# Patient Record
Sex: Male | Born: 1955 | Race: White | Hispanic: No | Marital: Married | Smoking: Never smoker
Health system: Southern US, Community
[De-identification: ages and names within clinical notes are randomized; demographics above are authoritative.]

## PROBLEM LIST (undated history)

## (undated) ENCOUNTER — Emergency Department (HOSPITAL_COMMUNITY): Admission: EM | Payer: 59 | Source: Home / Self Care

## (undated) DIAGNOSIS — Z125 Encounter for screening for malignant neoplasm of prostate: Secondary | ICD-10-CM

## (undated) DIAGNOSIS — L219 Seborrheic dermatitis, unspecified: Secondary | ICD-10-CM

## (undated) DIAGNOSIS — IMO0002 Reserved for concepts with insufficient information to code with codable children: Secondary | ICD-10-CM

## (undated) DIAGNOSIS — R21 Rash and other nonspecific skin eruption: Secondary | ICD-10-CM

## (undated) DIAGNOSIS — E7800 Pure hypercholesterolemia, unspecified: Secondary | ICD-10-CM

## (undated) DIAGNOSIS — J309 Allergic rhinitis, unspecified: Secondary | ICD-10-CM

## (undated) DIAGNOSIS — Z0000 Encounter for general adult medical examination without abnormal findings: Secondary | ICD-10-CM

## (undated) DIAGNOSIS — K2990 Gastroduodenitis, unspecified, without bleeding: Secondary | ICD-10-CM

## (undated) DIAGNOSIS — K2970 Gastritis, unspecified, without bleeding: Secondary | ICD-10-CM

## (undated) DIAGNOSIS — K649 Unspecified hemorrhoids: Secondary | ICD-10-CM

## (undated) DIAGNOSIS — K219 Gastro-esophageal reflux disease without esophagitis: Secondary | ICD-10-CM

## (undated) HISTORY — DX: Encounter for screening for malignant neoplasm of prostate: Z12.5

## (undated) HISTORY — DX: Encounter for general adult medical examination without abnormal findings: Z00.00

## (undated) HISTORY — DX: Unspecified hemorrhoids: K64.9

## (undated) HISTORY — PX: MRI: SHX5353

## (undated) HISTORY — DX: Gastro-esophageal reflux disease without esophagitis: K21.9

## (undated) HISTORY — DX: Gastroduodenitis, unspecified, without bleeding: K29.90

## (undated) HISTORY — DX: Rash and other nonspecific skin eruption: R21

## (undated) HISTORY — DX: Pure hypercholesterolemia, unspecified: E78.00

## (undated) HISTORY — DX: Reserved for concepts with insufficient information to code with codable children: IMO0002

## (undated) HISTORY — DX: Allergic rhinitis, unspecified: J30.9

## (undated) HISTORY — DX: Seborrheic dermatitis, unspecified: L21.9

## (undated) HISTORY — DX: Gastritis, unspecified, without bleeding: K29.70

---

## 2000-10-14 DIAGNOSIS — IMO0002 Reserved for concepts with insufficient information to code with codable children: Secondary | ICD-10-CM

## 2000-10-14 HISTORY — DX: Reserved for concepts with insufficient information to code with codable children: IMO0002

## 2003-02-22 LAB — FECAL OCCULT BLOOD, GUAIAC: Fecal Occult Blood: NEGATIVE

## 2003-08-17 LAB — HM COLONOSCOPY: HM Colonoscopy: NORMAL

## 2003-10-15 HISTORY — PX: ESOPHAGOGASTRODUODENOSCOPY: SHX1529

## 2003-10-15 HISTORY — PX: COLONOSCOPY: SHX174

## 2003-10-21 ENCOUNTER — Encounter (INDEPENDENT_AMBULATORY_CARE_PROVIDER_SITE_OTHER): Payer: Self-pay | Admitting: *Deleted

## 2003-10-21 ENCOUNTER — Ambulatory Visit (HOSPITAL_COMMUNITY): Admission: RE | Admit: 2003-10-21 | Discharge: 2003-10-21 | Payer: Self-pay | Admitting: Gastroenterology

## 2004-09-23 ENCOUNTER — Ambulatory Visit: Payer: Self-pay | Admitting: Family Medicine

## 2005-03-16 ENCOUNTER — Ambulatory Visit: Payer: Self-pay | Admitting: Family Medicine

## 2005-06-15 ENCOUNTER — Emergency Department (HOSPITAL_COMMUNITY): Admission: EM | Admit: 2005-06-15 | Discharge: 2005-06-15 | Payer: Self-pay | Admitting: Family Medicine

## 2005-09-27 ENCOUNTER — Ambulatory Visit: Payer: Self-pay | Admitting: Family Medicine

## 2005-10-15 ENCOUNTER — Ambulatory Visit: Payer: Self-pay | Admitting: Family Medicine

## 2005-10-22 ENCOUNTER — Ambulatory Visit: Payer: Self-pay | Admitting: Family Medicine

## 2005-12-06 ENCOUNTER — Ambulatory Visit: Payer: Self-pay | Admitting: Family Medicine

## 2006-03-07 ENCOUNTER — Ambulatory Visit: Payer: Self-pay | Admitting: Family Medicine

## 2006-10-03 ENCOUNTER — Ambulatory Visit: Payer: Self-pay | Admitting: Family Medicine

## 2006-11-18 ENCOUNTER — Ambulatory Visit: Payer: Self-pay | Admitting: Family Medicine

## 2007-02-02 ENCOUNTER — Encounter: Payer: Self-pay | Admitting: Family Medicine

## 2007-02-02 DIAGNOSIS — K219 Gastro-esophageal reflux disease without esophagitis: Secondary | ICD-10-CM

## 2007-02-02 DIAGNOSIS — K2970 Gastritis, unspecified, without bleeding: Secondary | ICD-10-CM | POA: Insufficient documentation

## 2007-02-02 DIAGNOSIS — K2990 Gastroduodenitis, unspecified, without bleeding: Secondary | ICD-10-CM

## 2007-02-02 DIAGNOSIS — E785 Hyperlipidemia, unspecified: Secondary | ICD-10-CM | POA: Insufficient documentation

## 2007-02-14 ENCOUNTER — Ambulatory Visit: Payer: Self-pay | Admitting: Family Medicine

## 2007-02-24 LAB — CONVERTED CEMR LAB
ALT: 18 units/L (ref 0–53)
Alkaline Phosphatase: 64 units/L (ref 39–117)
BUN: 15 mg/dL (ref 6–23)
Basophils Relative: 0.3 % (ref 0.0–1.0)
Bilirubin, Direct: 0.1 mg/dL (ref 0.0–0.3)
CO2: 29 meq/L (ref 19–32)
Calcium: 9.3 mg/dL (ref 8.4–10.5)
Cholesterol: 248 mg/dL (ref 0–200)
Creatinine, Ser: 1 mg/dL (ref 0.4–1.5)
Eosinophils Relative: 2.5 % (ref 0.0–5.0)
GFR calc Af Amer: 102 mL/min
Glucose, Bld: 93 mg/dL (ref 70–99)
Hemoglobin: 15 g/dL (ref 13.0–17.0)
Lymphocytes Relative: 24.6 % (ref 12.0–46.0)
Monocytes Absolute: 0.3 10*3/uL (ref 0.2–0.7)
Monocytes Relative: 4.4 % (ref 3.0–11.0)
Neutro Abs: 4 10*3/uL (ref 1.4–7.7)
Platelets: 309 10*3/uL (ref 150–400)
RDW: 12.1 % (ref 11.5–14.6)
Total Bilirubin: 1.5 mg/dL — ABNORMAL HIGH (ref 0.3–1.2)
Total CHOL/HDL Ratio: 6.6
Total Protein: 7.1 g/dL (ref 6.0–8.3)
VLDL: 26 mg/dL (ref 0–40)
WBC: 5.9 10*3/uL (ref 4.5–10.5)

## 2007-05-25 ENCOUNTER — Ambulatory Visit: Payer: Self-pay | Admitting: Family Medicine

## 2007-05-30 ENCOUNTER — Encounter (INDEPENDENT_AMBULATORY_CARE_PROVIDER_SITE_OTHER): Payer: Self-pay | Admitting: *Deleted

## 2007-05-30 LAB — CONVERTED CEMR LAB
ALT: 19 units/L (ref 0–53)
AST: 20 units/L (ref 0–37)
Direct LDL: 180.8 mg/dL
Total CHOL/HDL Ratio: 6.5
Triglycerides: 87 mg/dL (ref 0–149)
VLDL: 17 mg/dL (ref 0–40)

## 2007-06-07 ENCOUNTER — Telehealth: Payer: Self-pay | Admitting: Family Medicine

## 2007-07-12 ENCOUNTER — Ambulatory Visit: Payer: Self-pay | Admitting: Family Medicine

## 2007-10-02 ENCOUNTER — Encounter: Admission: RE | Admit: 2007-10-02 | Discharge: 2007-10-02 | Payer: Self-pay | Admitting: Family Medicine

## 2007-10-02 ENCOUNTER — Telehealth: Payer: Self-pay | Admitting: Family Medicine

## 2007-10-02 ENCOUNTER — Ambulatory Visit: Payer: Self-pay | Admitting: Family Medicine

## 2007-10-02 ENCOUNTER — Ambulatory Visit: Payer: Self-pay | Admitting: Cardiovascular Disease

## 2007-10-04 ENCOUNTER — Encounter: Payer: Self-pay | Admitting: Family Medicine

## 2007-10-04 ENCOUNTER — Telehealth: Payer: Self-pay | Admitting: Family Medicine

## 2007-10-05 ENCOUNTER — Encounter: Payer: Self-pay | Admitting: Family Medicine

## 2007-11-01 ENCOUNTER — Telehealth: Payer: Self-pay | Admitting: Family Medicine

## 2007-11-09 ENCOUNTER — Encounter: Payer: Self-pay | Admitting: Family Medicine

## 2008-02-19 ENCOUNTER — Ambulatory Visit: Payer: Self-pay | Admitting: Family Medicine

## 2008-02-21 LAB — CONVERTED CEMR LAB
Cholesterol: 205 mg/dL (ref 0–200)
Direct LDL: 151.2 mg/dL
Total CHOL/HDL Ratio: 6.3

## 2008-06-07 ENCOUNTER — Ambulatory Visit: Payer: Self-pay | Admitting: Family Medicine

## 2008-06-07 DIAGNOSIS — K649 Unspecified hemorrhoids: Secondary | ICD-10-CM | POA: Insufficient documentation

## 2008-08-29 ENCOUNTER — Ambulatory Visit: Payer: Self-pay | Admitting: Family Medicine

## 2008-08-29 DIAGNOSIS — L219 Seborrheic dermatitis, unspecified: Secondary | ICD-10-CM | POA: Insufficient documentation

## 2008-09-05 ENCOUNTER — Telehealth (INDEPENDENT_AMBULATORY_CARE_PROVIDER_SITE_OTHER): Payer: Self-pay | Admitting: *Deleted

## 2008-10-29 ENCOUNTER — Telehealth (INDEPENDENT_AMBULATORY_CARE_PROVIDER_SITE_OTHER): Payer: Self-pay | Admitting: *Deleted

## 2008-11-13 ENCOUNTER — Ambulatory Visit: Payer: Self-pay | Admitting: Family Medicine

## 2008-11-13 DIAGNOSIS — J309 Allergic rhinitis, unspecified: Secondary | ICD-10-CM | POA: Insufficient documentation

## 2008-12-16 ENCOUNTER — Ambulatory Visit: Payer: Self-pay | Admitting: Family Medicine

## 2008-12-17 LAB — CONVERTED CEMR LAB
AST: 21 units/L (ref 0–37)
Cholesterol: 196 mg/dL (ref 0–200)
Triglycerides: 98 mg/dL (ref 0.0–149.0)
VLDL: 19.6 mg/dL (ref 0.0–40.0)

## 2009-04-30 ENCOUNTER — Ambulatory Visit: Payer: Self-pay | Admitting: Family Medicine

## 2009-07-07 ENCOUNTER — Ambulatory Visit: Payer: Self-pay | Admitting: Family Medicine

## 2009-07-07 DIAGNOSIS — R21 Rash and other nonspecific skin eruption: Secondary | ICD-10-CM

## 2009-07-11 IMAGING — CT CT PELVIS W/O CM
2 of 4 series · 17 of 46 positions shown, 19 images · IV contrast (agent unspecified)
Comparison: none

CLINICAL DATA: Left flank pain, nausea, microhematuria.
ABDOMEN CT WITHOUT CONTRAST:
TECHNIQUE: Multidetector CT imaging of the abdomen was performed following the standard protocol without IV contrast.
TECHNIQUE: Multidetector CT imaging of the pelvis was performed following the standard protocol without IV contrast.

[Series 2: abd_pel_w/o 5.0 b30f st · axial · 0.77mm/px · z∈[-484,-30]mm · 14 of 101 slices shown, 16 images]
[im 5/101  soft-tissue]
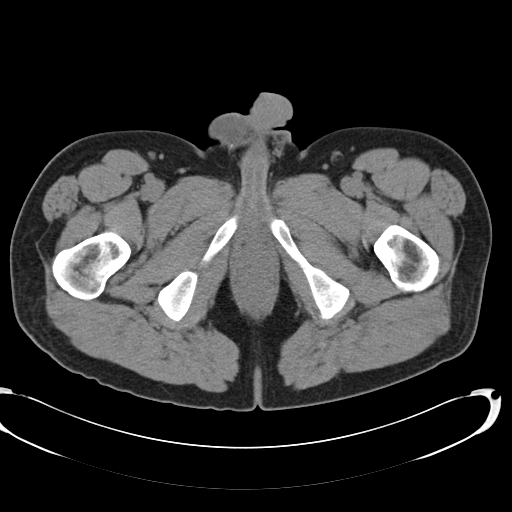
[im 5/101  bone]
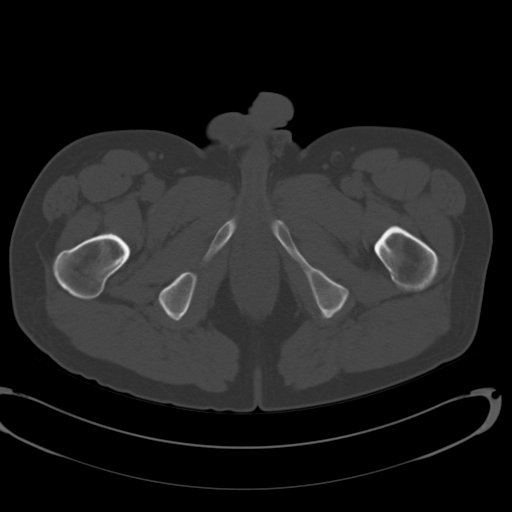
[im 14/101  soft-tissue]
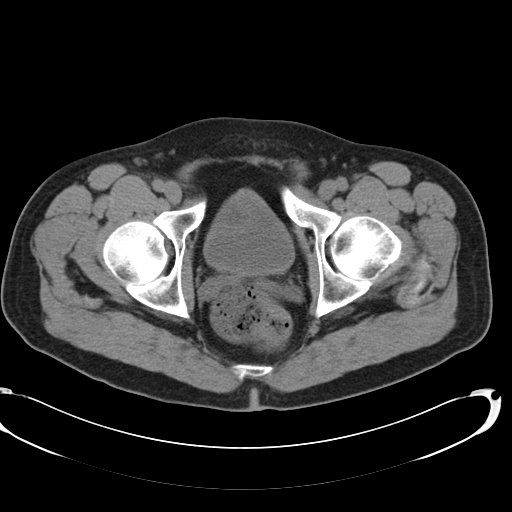
[im 18/101  soft-tissue]
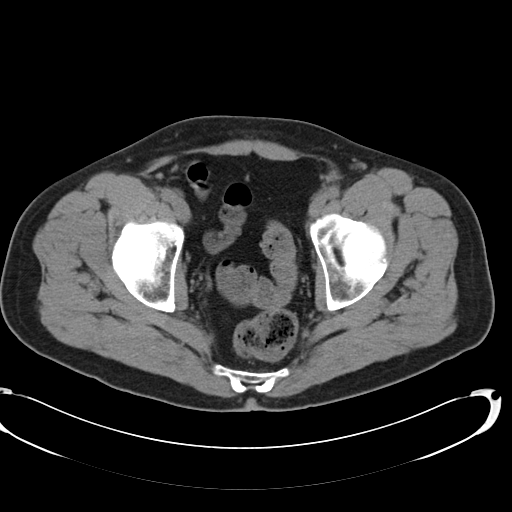
[im 27/101  soft-tissue]
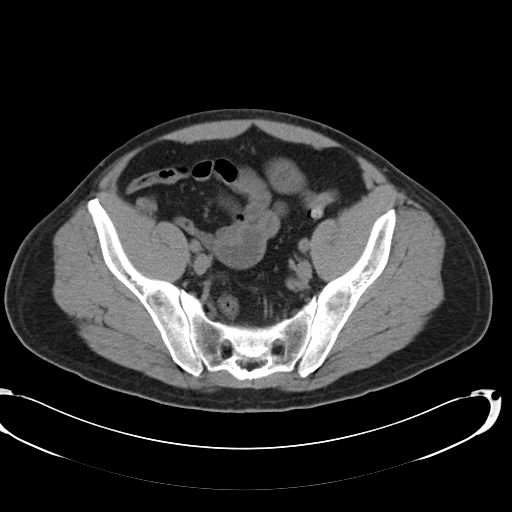
[im 35/101  soft-tissue]
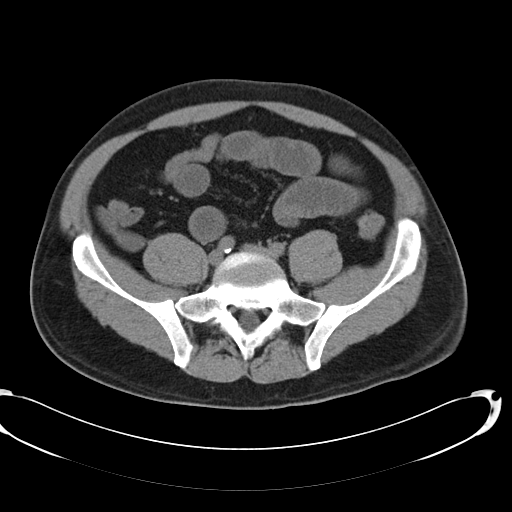
[im 40/101  soft-tissue]
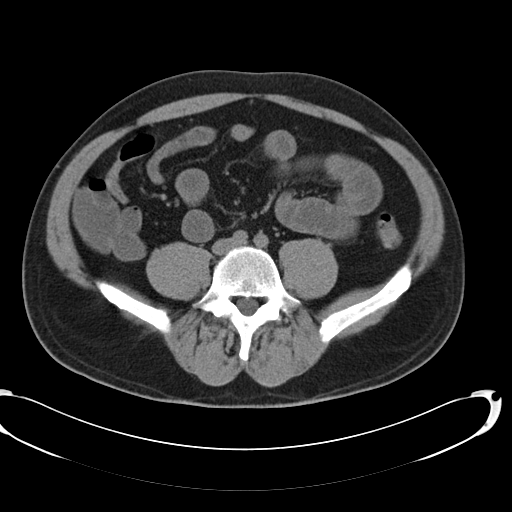
[im 48/101  soft-tissue]
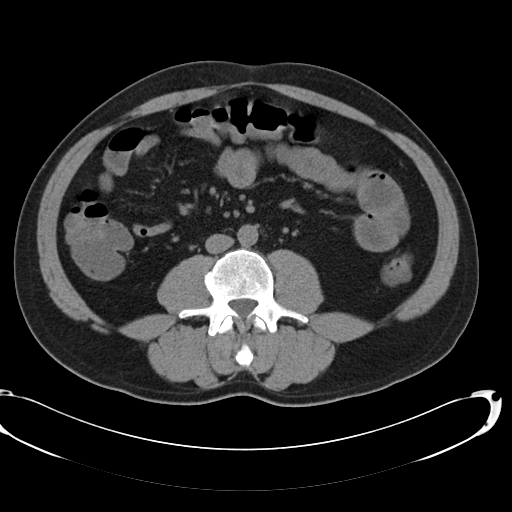
[im 53/101  soft-tissue]
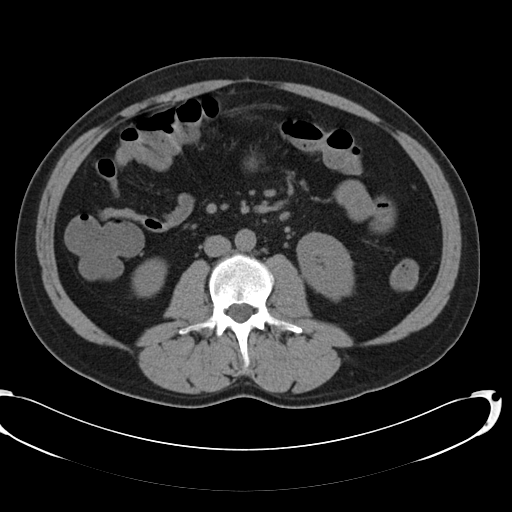
[im 61/101  soft-tissue]
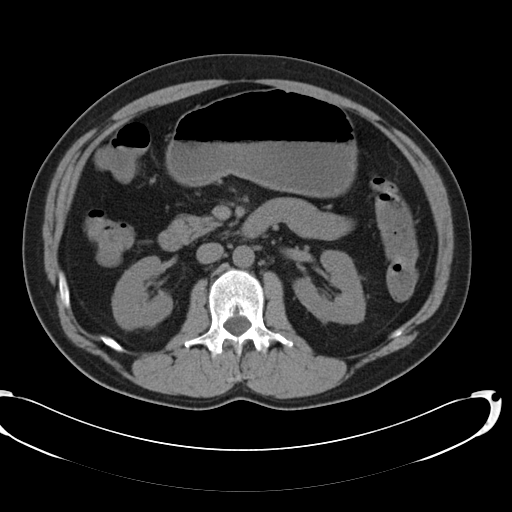
[im 61/101  bone]
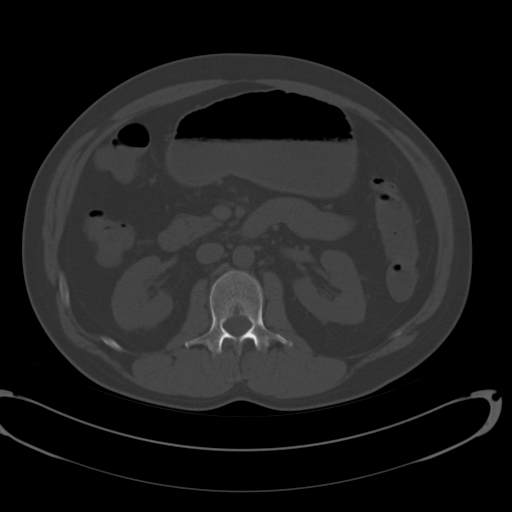
[im 66/101  soft-tissue]
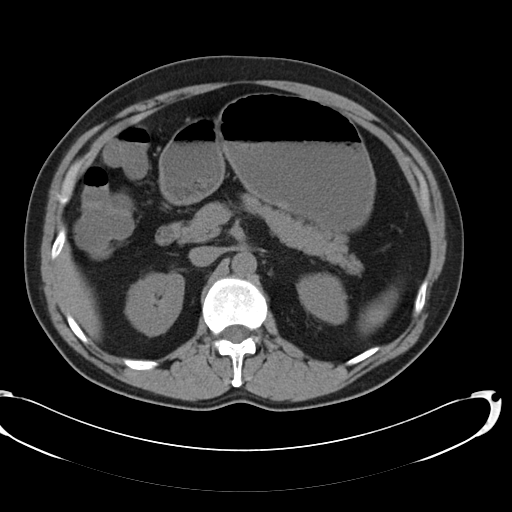
[im 74/101  soft-tissue]
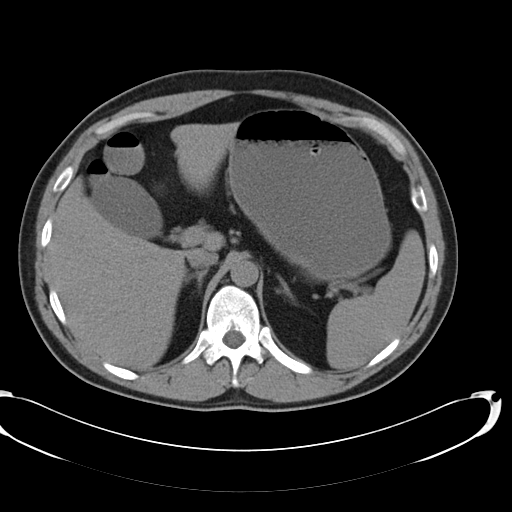
[im 83/101  soft-tissue]
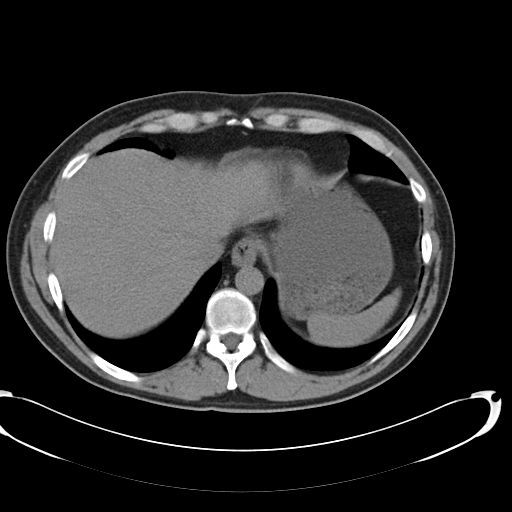
[im 87/101  soft-tissue]
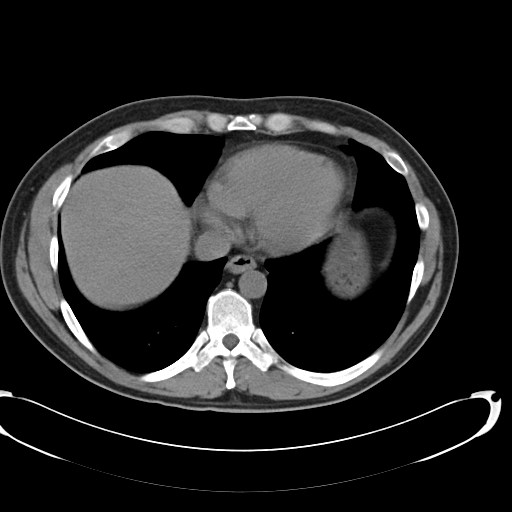
[im 96/101  soft-tissue]
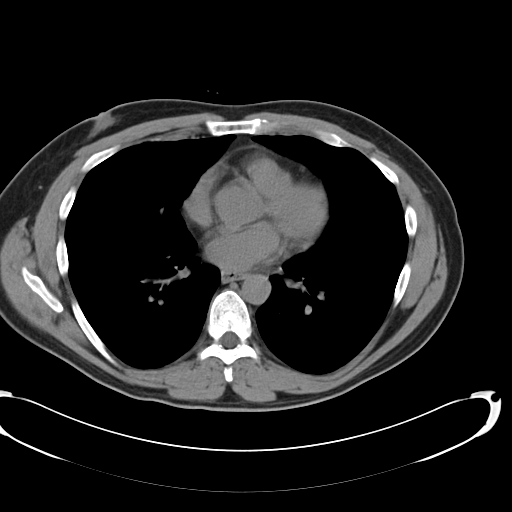

[Series 602: <mpr range> · coronal · 1.00mm/px · 3 of 126 slices shown]
[im 42/126  soft-tissue]
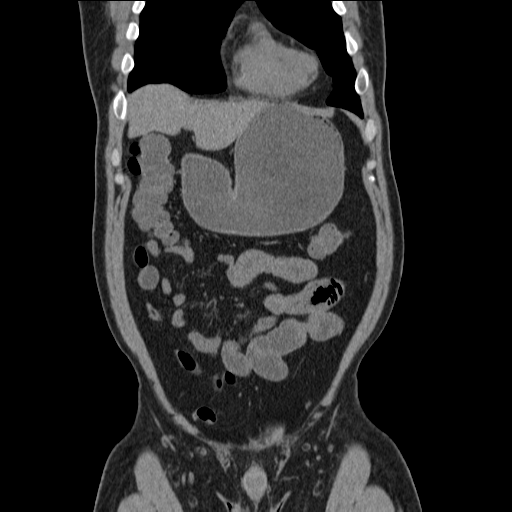
[im 56/126  soft-tissue]
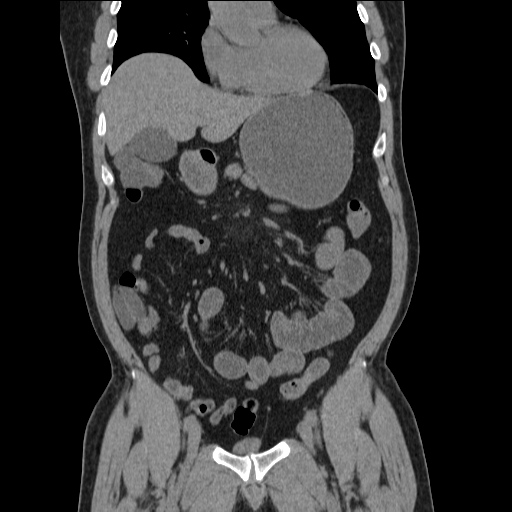
[im 70/126  soft-tissue]
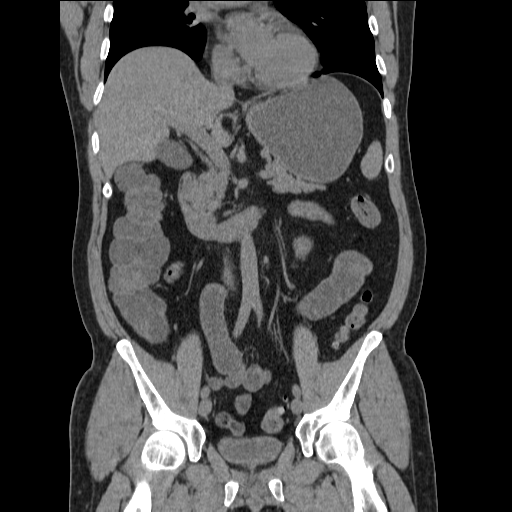

[17 of 46 positions shown; findings below may reference images not displayed]

FINDINGS: No urinary tract calcifications.  No evidence urinary tract obstruction.  No hydroureteronephrosis.  No renal or perirenal mass.  No cortical scarring.  No acute abdominal findings are appreciated.  There are some fluid-distended loops of small bowel in the mid and upper abdomen with nondistended distal ileal loops and no evidence of significant colonic distention.  Is there any clinical concern for partial small bowel obstruction?
IMPRESSION: No acute urinary tract abnormality.  No urinary tract calcifications or mass.  Dilated fluid-filled loops of small bowel raise the question of possible SBO.  Consider plain radiographs for followup.
PELVIS CT WITHOUT CONTRAST:
FINDINGS: Prostate gland is enlarged with transverse dimension of 5.8 cm.  Seminal vesicles unremarkable.  Bladder partially decompressed.  Generous amount of stool in the rectosigmoid.  Nondistended distal ileal loops in comparison to the caliber of the proximal to mid small bowel ? see abdominal findings above.
IMPRESSION: Prostate gland enlargement.  No acute primary pelvic findings.  See abdominal CT for additional observations.

## 2009-07-14 ENCOUNTER — Telehealth: Payer: Self-pay | Admitting: Family Medicine

## 2009-07-23 ENCOUNTER — Ambulatory Visit: Payer: Self-pay | Admitting: Family Medicine

## 2009-07-24 LAB — CONVERTED CEMR LAB
ALT: 21 units/L (ref 0–53)
AST: 22 units/L (ref 0–37)
Albumin: 4.1 g/dL (ref 3.5–5.2)
Alkaline Phosphatase: 66 units/L (ref 39–117)
Basophils Relative: 0.7 % (ref 0.0–3.0)
CO2: 31 meq/L (ref 19–32)
Calcium: 9.5 mg/dL (ref 8.4–10.5)
Eosinophils Absolute: 0.2 10*3/uL (ref 0.0–0.7)
Eosinophils Relative: 1.8 % (ref 0.0–5.0)
Hemoglobin: 15.1 g/dL (ref 13.0–17.0)
LDL Cholesterol: 117 mg/dL — ABNORMAL HIGH (ref 0–99)
Lymphocytes Relative: 23.9 % (ref 12.0–46.0)
MCHC: 34.1 g/dL (ref 30.0–36.0)
Monocytes Relative: 5.3 % (ref 3.0–12.0)
Neutro Abs: 5.7 10*3/uL (ref 1.4–7.7)
PSA: 1.28 ng/mL (ref 0.10–4.00)
RBC: 4.8 M/uL (ref 4.22–5.81)
Sodium: 139 meq/L (ref 135–145)
Total CHOL/HDL Ratio: 4
Total Protein: 6.9 g/dL (ref 6.0–8.3)
WBC: 8.6 10*3/uL (ref 4.5–10.5)

## 2009-07-28 ENCOUNTER — Telehealth: Payer: Self-pay | Admitting: Family Medicine

## 2009-07-30 ENCOUNTER — Ambulatory Visit: Payer: Self-pay | Admitting: Family Medicine

## 2009-12-26 ENCOUNTER — Telehealth: Payer: Self-pay | Admitting: Family Medicine

## 2010-03-03 ENCOUNTER — Telehealth: Payer: Self-pay | Admitting: Family Medicine

## 2010-03-24 ENCOUNTER — Telehealth: Payer: Self-pay | Admitting: Family Medicine

## 2010-04-29 ENCOUNTER — Ambulatory Visit: Payer: Self-pay | Admitting: Family Medicine

## 2010-04-30 ENCOUNTER — Telehealth: Payer: Self-pay | Admitting: Family Medicine

## 2010-09-13 LAB — CONVERTED CEMR LAB
Bilirubin Urine: NEGATIVE
Casts: 0 /lpf
Epithelial cells, urine: 1 /lpf
Nitrite: NEGATIVE
Specific Gravity, Urine: 1.025
Urine crystals, microscopic: 0 /hpf
Urobilinogen, UA: 0.2
Yeast, UA: 0
pH: 6

## 2010-09-15 NOTE — Assessment & Plan Note (Signed)
Summary: ?SINUS INFECTION/CLE   Vital Signs:  Patient profile:   55 year old male Height:      68.5 inches Weight:      180.25 pounds BMI:     27.11 Temp:     98 degrees F oral Pulse rate:   80 / minute Pulse rhythm:   regular BP sitting:   128 / 84  (left arm) Cuff size:   regular  Vitals Entered By: Lewanda Rife LPN (April 29, 2010 4:15 PM) CC: ?sinus infection, head congested for two weeks, tickling in throat,and when blows nose has yellowish and thick mucus.   History of Present Illness: is sick -  ? sinusitis  started 2 weeks ago  first 2 days had sore throat and sneezing and cough  after 4 days felt some better  then came back with a vengance   head congestion and pain  pain is bad behind eyes and top of head  ears hurt and pop  yellow nasal drainge - thick , some blood in ams   cough is very midl   2 days with a little fever this week   tried claritin D -- made him jittery   then tried nyquil   Allergies: 1)  ! Lipitor (Atorvastatin Calcium) 2)  Penicillin  Past History:  Past Medical History: Last updated: 11/13/2008 GERD hyperlipidemia  gastritis (pt knows not to take nsaids but does occas anyway) allergic rhinitis   Past Surgical History: Last updated: 02/02/2007 Degenerative disk disease (10/2000) MRI EGD- HH, had peptic bleeding, gastritis, neg H pylori (10/2003) Colonoscopy (10/2003)  Family History: Last updated: 02/14/2007 Father: borderline DM, elevated PSA Mother: htn Siblings: 1 sister GM htn PGF prostate ca MGF CVA  Social History: Last updated: 11/13/2008 Marital Status: Married Children:  Occupation: [employer] non smoker no alcohol   Risk Factors: Smoking Status: never (02/02/2007)  Review of Systems General:  Complains of chills, fatigue, and loss of appetite. Eyes:  Denies blurring and eye irritation. ENT:  Complains of nasal congestion, sinus pressure, and sore throat. CV:  Denies chest pain or discomfort and  palpitations. Resp:  Complains of cough; denies wheezing. GI:  Denies diarrhea, nausea, and vomiting. Derm:  Denies rash.  Physical Exam  General:  Well-developed,well-nourished,in no acute distress; alert,appropriate and cooperative throughout examination Head:  mild maxillary sinus tenderness  normocephalic, atraumatic, and no abnormalities observed.   Eyes:  vision grossly intact, pupils equal, pupils round, pupils reactive to light, and no injection.   Ears:  R ear normal and L ear normal.   Nose:  nares are injected and congested bilaterally  Mouth:  pharynx pink and moist, no erythema, and no exudates.   Neck:  supple with full rom and no masses or thyromegally, no JVD or carotid bruit  Lungs:  Normal respiratory effort, chest expands symmetrically. Lungs are clear to auscultation, no crackles or wheezes. Heart:  Normal rate and regular rhythm. S1 and S2 normal without gallop, murmur, click, rub or other extra sounds. Skin:  Intact without suspicious lesions or rashes Cervical Nodes:  No lymphadenopathy noted Psych:  normal affect, talkative and pleasant    Impression & Recommendations:  Problem # 1:  SINUSITIS - ACUTE-NOS (ICD-461.9) Assessment New  tx with levaquin (pt is pcn allergic )  recommend sympt care- see pt instructions   pt advised to update me if symptoms worsen or do not improve  His updated medication list for this problem includes:    Flonase 50 Mcg/act Susp (Fluticasone  propionate) .Marland Kitchen... 2 sprays in each nostril once daily during allergy season as needed    Claritin-d 24 Hour 10-240 Mg Xr24h-tab (Loratadine-pseudoephedrine) ..... Otc as directed.    Nyquil 60-7.01-12-999 Mg/54ml Liqd (Pseudoeph-doxylamine-dm-apap) ..... Otc as directed.    Levaquin 500 Mg Tabs (Levofloxacin) .Marland Kitchen... 1 by mouth once daily for 10 days for sinus infection  Orders: Prescription Created Electronically [phone])  Complete Medication List: 1)  Multivitamins Tabs (Multiple vitamin)  .... One by mouth daily 2)  Elocon 0.1 % Crea (Mometasone furoate) .... Apply small amt to affected area on ear once daily as needed 3)  Flonase 50 Mcg/act Susp (Fluticasone propionate) .... 2 sprays in each nostril once daily during allergy season as needed 4)  Red Yeast Rice  .... Take by mouth as directed 5)  Zantac 150 Mg Tabs (Ranitidine hcl) .Marland Kitchen.. 1 by mouth two times a day 6)  Claritin-d 24 Hour 10-240 Mg Xr24h-tab (Loratadine-pseudoephedrine) .... Otc as directed. 7)  Nyquil 60-7.01-12-999 Mg/5ml Liqd (Pseudoeph-doxylamine-dm-apap) .... Otc as directed. 8)  Fish Oil 1200 Mg Caps (Omega-3 fatty acids) .... Two capsules by mouth twice a day. 9)  Levaquin 500 Mg Tabs (Levofloxacin) .Marland Kitchen.. 1 by mouth once daily for 10 days for sinus infection  Patient Instructions: 1)  you can try mucinex over the counter twice daily as directed and nasal saline spray for congestion or a netti pot  2)  drink lots of water  3)  tylenol over the counter as directed may help with aches, headache and fever 4)  call if symptoms worsen or if not improved in 4-5 days  5)  take the levaquin as directed  Prescriptions: LEVAQUIN 500 MG TABS (LEVOFLOXACIN) 1 by mouth once daily for 10 days for sinus infection  #10 x 0   Entered and Authorized by:   Judith Part MD   Signed by:   Judith Part MD on 04/29/2010   Method used:   [facility] [address] [id number]       [address]       [place], [place]  [address]       Ph: [phone]       Fax: [phone]   RxID:   [id number]   Current Allergies (reviewed today): ! LIPITOR (ATORVASTATIN CALCIUM) PENICILLIN

## 2010-09-15 NOTE — Progress Notes (Signed)
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 He says that he has been doing oatmeal baths, using cornstarch, desitin. He is asking if he could try anything else. [facility] [address] [facility] needed. Initial call taken by: Melody Comas,  March 24, 2010 2:07 PM  Follow-up for Phone Call        try some antifungal product for jock itch - like tinactin or lotrimin (cream or powder) otc  f/u if not improved Follow-up by: Judith Part MD,  March 24, 2010 5:15 PM  Additional Follow-up for Phone Call Additional follow up Details #1::        Patient advised as instructed via telephone. Additional Follow-up by: Linde Gillis CMA Duncan Dull),  March 24, 2010 5:21 PM

## 2010-09-15 NOTE — Progress Notes (Signed)
Summary: regarding zantac  Phone Note Call from Patient Call back at [phone]   Caller: Patient Call For: Judith Part MD Summary of Call: Pt has been taking otc zantac for reflux and he is asking if he can get a script for this.  He is taking 150 mg's two times a day and it would be less expensive for him if he can get a script.  [facility] [address] Initial call taken by: Lowella Petties CMA,  March 03, 2010 12:58 PM  Follow-up for Phone Call        that is fine  px written on EMR for call in  I assume that means he is no longer taking prevacid so I will take that off his list -- please verify that  Follow-up by: Judith Part MD,  March 03, 2010 1:34 PM  Additional Follow-up for Phone Call Additional follow up Details #1::        Patient notified as instructed by telephone. Pt said he is no longer taking Prevacid. [facility] as instructed. Lewanda Rife LPN  March 03, 2010 4:47 PM     New/Updated Medications: ZANTAC 150 MG TABS (RANITIDINE HCL) 1 by mouth two times a day Prescriptions: ZANTAC 150 MG TABS (RANITIDINE HCL) 1 by mouth two times a day  #180 x 3   Entered and Authorized by:   Judith Part MD   Signed by:   Lewanda Rife LPN on 11/91/4782   Method used:   Telephoned to ...       [facility]  [address] [id number]       [address]       [place], [place]  [address]       Ph: [phone]       Fax: [phone]   RxID:   [id number]

## 2010-09-15 NOTE — Progress Notes (Signed)
Summary: wants to change from prevacid  Phone Note Call from Patient Call back at Home Phone [phone] Call back at [phone]   Caller: Patient Call For: Judith Part MD Summary of Call: Pt has been taking prevacid for reflux for several years, and it's worked well for him, but he has been hearing a lot of negative information that prevacid can cause hip and vertebral fractures.  He would like to try a different medicine.  [facility] [address]. Initial call taken by: Lowella Petties CMA,  Dec 26, 2009 3:54 PM  Follow-up for Phone Call        all medicines that cut acid in stomach can slt impair calcium absorbtion - thus inc risk later of osteoporosis (moreso for women than men)  can try some zantac 150 otc two times a day -- this may have less of a potential to do that although no conclusive studies yet  let me know how it goes  Follow-up by: Judith Part MD,  Dec 26, 2009 4:24 PM  Additional Follow-up for Phone Call Additional follow up Details #1::        Patient notified as instructed by telephone. Pt will call back with update of how doing later.Lewanda Rife LPN  Dec 26, 2009 4:51 PM

## 2010-09-15 NOTE — Progress Notes (Signed)
Summary: regarding levaquin  Phone Note Call from Patient Call back at [phone]   Caller: Patient Call For: Judith Part MD Summary of Call: Pt was given levaquin yesterday for a sinus infection.  He is concerned because pt information said not to take this if you have had swelling or pain in your hands and he does.  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  He is asking if he should change to another abx.  [facility] [address]. Initial call taken by: Lowella Petties CMA,  April 30, 2010 10:44 AM  Follow-up for Phone Call        this med can cause some aches and pains -- it generally does not but if he is not comfortable with it we can switch please call in zpak take by mouth as directed # 1 pack no ref  let him know this is a little less potent for sinusitis so if he does not improve let me know  Follow-up by: Judith Part MD,  April 30, 2010 11:24 AM  Additional Follow-up for Phone Call Additional follow up Details #1::        Patient notified as instructed by telephone. Pt said he has pain anyway due to repetative motion from delivering the mail. Pt said pain is not worse now than what he normally experiences. Pt said would stay on Levaquin and if he does have any worsening of pain he will call Dr Milinda Antis back.Lewanda Rife LPN  April 30, 2010 11:58 AM

## 2010-10-23 ENCOUNTER — Ambulatory Visit: Payer: Self-pay | Admitting: Family Medicine

## 2010-10-23 ENCOUNTER — Encounter: Payer: Self-pay | Admitting: Family Medicine

## 2010-10-23 ENCOUNTER — Ambulatory Visit (INDEPENDENT_AMBULATORY_CARE_PROVIDER_SITE_OTHER): Payer: Managed Care, Other (non HMO) | Admitting: Family Medicine

## 2010-10-23 DIAGNOSIS — K649 Unspecified hemorrhoids: Secondary | ICD-10-CM

## 2010-10-27 ENCOUNTER — Ambulatory Visit: Payer: Self-pay | Admitting: Family Medicine

## 2010-10-27 NOTE — Assessment & Plan Note (Signed)
Summary: ?HEMORRHOID/CLE   Vital Signs:  Patient profile:   55 year old male Weight:      180.25 pounds Temp:     98.3 degrees F oral Pulse rate:   84 / minute Pulse rhythm:   regular BP sitting:   150 / 100  (left arm) Cuff size:   regular  Vitals Entered By: Selena Batten Dance CMA Duncan Dull) (October 23, 2010 4:17 PM) CC: Hemorrhoid   History of Present Illness: CC: hemorrhoid?  2-3 d h/o sore in rear while showering.  checked bottom and felt hemorrhoid larger.    No problems with bowels, no bleeding.  No itching.    BMs - 1 soft stool twice daily.  good amt fiber in diet, not enough water.  h/o hemorrhoids, last had issue 1 mo ago.  Used proctosol cream in past, has helped.  also used suppositories in past.    colonoscopy 2005, thinks normal.  Current Medications (verified): 1)  Multivitamins  Tabs (Multiple Vitamin) .... One By Mouth Daily 2)  Zantac 150 Mg Tabs (Ranitidine Hcl) .Marland Kitchen.. 1 By Mouth Two Times A Day 3)  Fish Oil 1200 Mg Caps (Omega-3 Fatty Acids) .... Two Capsules By Mouth Twice A Day. 4)  Celebrex 200 Mg Caps (Celecoxib) .Marland Kitchen.. 1 By Mouth Once Daily 5)  Tramadol Hcl 50 Mg Tabs (Tramadol Hcl) .Marland Kitchen.. 1 Up To Two Times A Day As Needed For Pain  Allergies: 1)  ! Lipitor (Atorvastatin Calcium) 2)  Penicillin  Past History:  Past Medical History: Last updated: 11/13/2008 GERD hyperlipidemia  gastritis (pt knows not to take nsaids but does occas anyway) allergic rhinitis   Social History: Last updated: 11/13/2008 Marital Status: Married Children:  Occupation: [employer] non smoker no alcohol   Past Surgical History: Degenerative disk disease (10/2000) MRI EGD- HH, had peptic bleeding, gastritis, neg H pylori (10/2003) Colonoscopy (10/2003)  Review of Systems       per HPI  Physical Exam  General:  Well-developed,well-nourished,in no acute distress; alert,appropriate and cooperative throughout examination Rectal:  posterior anus with  ~1.5cm prolapsed int  hemorrhoid, slight inflammed, no bleeding, no thrombosis.  nontender.  no anal irritation surrounding.   Impression & Recommendations:  Problem # 1:  HEMORRHOIDS (ICD-455.6) prolapsed internal.  not acutely inflamed.  treat with proctosol, suppository, sitz bath.  if not improving in 1 wk, referral to surg for eval.    Complete Medication List: 1)  Multivitamins Tabs (Multiple vitamin) .... One by mouth daily 2)  Zantac 150 Mg Tabs (Ranitidine hcl) .Marland Kitchen.. 1 by mouth two times a day 3)  Fish Oil 1200 Mg Caps (Omega-3 fatty acids) .... Two capsules by mouth twice a day. 4)  Celebrex 200 Mg Caps (Celecoxib) .Marland Kitchen.. 1 by mouth once daily 5)  Tramadol Hcl 50 Mg Tabs (Tramadol hcl) .Marland Kitchen.. 1 up to two times a day as needed for pain 6)  Proctosol Hc 2.5 % Crea (Hydrocortisone) .... Apply to area twice daily for 1 wk 7)  Anucort-hc 25 Mg Supp (Hydrocortisone acetate) .... One twice daily for 1 wk  Patient Instructions: 1)  warm sitz baths. 2)  continue proctosol. 3)  may use suppository if not resolving. 4)  If pain, worsening swelling, or not resolving as expected, call us for surgical eval. Prescriptions: ANUCORT-HC 25 MG SUPP (HYDROCORTISONE ACETATE) one twice daily for 1 wk  #1 x 0   Entered and Authorized by:   Eustaquio Boyden  MD   Signed by:   Eustaquio Boyden  MD on 10/23/2010   Method used:   [facility]        [facility]  [address] [facility] [address] [id number]       [address]       [place], [place]  [address]       Ph: [phone]       Fax: [phone]   RxID:   [id number] PROCTOSOL HC 2.5 % CREA (HYDROCORTISONE) apply to area twice daily for 1 wk  #1 x 0   Entered and Authorized by:   Eustaquio Boyden  MD   Signed by:   Eustaquio Boyden  MD on 10/23/2010   Method used:   [facility]        [facility]  [address] [id number]       [address]       [place], [place]  [address]       Ph: [phone]       Fax: [phone]    RxID:   [id number]    Orders Added: 1)  Est. Patient Level III [[address]]    Current Allergies (reviewed today): ! LIPITOR (ATORVASTATIN CALCIUM) PENICILLIN

## 2010-11-12 ENCOUNTER — Other Ambulatory Visit: Payer: Self-pay | Admitting: Family Medicine

## 2010-11-12 DIAGNOSIS — K649 Unspecified hemorrhoids: Secondary | ICD-10-CM

## 2010-11-12 MED ORDER — HYDROCORTISONE 2.5 % RE CREA: TOPICAL_CREAM | Freq: Two times a day (BID) | RECTAL | Status: AC

## 2010-11-12 NOTE — Telephone Encounter (Signed)
Patient requested refill on hemorrhoid prescriptions. Your last note said if no better then call for surgery referral. Do you want me to refill or call him about a referral?

## 2010-11-12 NOTE — Telephone Encounter (Signed)
May refill.  If not resolving, may need surgical referral.

## 2010-11-30 ENCOUNTER — Encounter: Payer: Self-pay | Admitting: Family Medicine

## 2010-11-30 ENCOUNTER — Ambulatory Visit (INDEPENDENT_AMBULATORY_CARE_PROVIDER_SITE_OTHER): Payer: Managed Care, Other (non HMO) | Admitting: Family Medicine

## 2010-11-30 DIAGNOSIS — J329 Chronic sinusitis, unspecified: Secondary | ICD-10-CM

## 2010-11-30 MED ORDER — FLUTICASONE PROPIONATE 50 MCG/ACT NA SUSP: 2.0000 | Freq: Every day | NASAL | Status: DC

## 2010-11-30 NOTE — Progress Notes (Signed)
  Subjective:    Patient ID: Richard Yates, male    DOB: [birth year], 55 y.o.   MRN: [id number]  HPI CC: sinus infection?  5d h/o nasal congestion with bloody mucous transitioning to thick yellow/green mucous out of nose.  Started coughing some yesterday - green sputum.  + HA and sinus pressure worse behind eyes.    No fevers/chills, abd ain, n/v/d, rashes.  No sick contacts at home, no smokers at home, no h/o asthma, COPD.  + allergies.  Using nasal saline which helps.  Also using 24 hour allergy medicine.  Medications and allergies reviewed and updated as above. PMHx reviewed for relevance.  Review of Systems Per HPI    Objective:   Physical Exam  Constitutional: He appears well-developed and well-nourished. No distress.  HENT:  Head: Normocephalic and atraumatic.  Right Ear: Tympanic membrane, external ear and ear canal normal.  Left Ear: Tympanic membrane, external ear and ear canal normal.  Nose: Nose normal. No mucosal edema or rhinorrhea. Right sinus exhibits no maxillary sinus tenderness and no frontal sinus tenderness. Left sinus exhibits no maxillary sinus tenderness and no frontal sinus tenderness.  Mouth/Throat: Oropharynx is clear and moist. No oropharyngeal exudate.       Mild irritation of nasal epithelium bilaterally  Eyes: Conjunctivae and EOM are normal. Pupils are equal, round, and reactive to light. No scleral icterus.  Neck: Normal range of motion. Neck supple. No thyromegaly present.  Cardiovascular: Normal rate, regular rhythm, normal heart sounds and intact distal pulses.   No murmur heard. Pulmonary/Chest: Effort normal and breath sounds normal. No respiratory distress. He has no wheezes. He has no rales.  Lymphadenopathy:    He has no cervical adenopathy.  Skin: Skin is warm and dry. No rash noted.          Assessment & Plan:

## 2010-11-30 NOTE — Assessment & Plan Note (Signed)
Acute sinusitis, early likely viral. Supportive care as per instructions. Update if not improved by end of week for possible abx course.

## 2010-11-30 NOTE — Patient Instructions (Addendum)
You have a sinus infection.  Most are viral to start off and should last between 7-10 days. Take medicine as prescribed: start flonase 2 sprays into each nostril daily. Push fluids and plenty of rest. Nasal saline irrigation or neti pot to help drain sinuses. May use simple mucinex (or immediate release guaifenesin) with plenty of fluid to help mobilize mucous. If not improving by Thursday/Friday or any worsening or fever >101.5, call us with update. Return if fever >101.5, trouble opening/closing mouth, difficulty swallowing, or worsening.

## 2010-12-01 ENCOUNTER — Telehealth: Payer: Self-pay | Admitting: *Deleted

## 2010-12-01 MED ORDER — GUAIFENESIN-CODEINE 100-10 MG/5ML PO SYRP: 5.0000 mL | ORAL_SOLUTION | Freq: Three times a day (TID) | ORAL | Status: DC | PRN

## 2010-12-01 NOTE — Telephone Encounter (Signed)
plz call in and notify patient.

## 2010-12-01 NOTE — Telephone Encounter (Signed)
Pt was seen yesterday and offered cough medicine, which he declined because he thought he had some at home. He does not and he is asking that something be called to [address].

## 2010-12-02 NOTE — Telephone Encounter (Signed)
Rx called in as directed. Message left notifying patient of Rx

## 2010-12-04 ENCOUNTER — Telehealth: Payer: Self-pay | Admitting: *Deleted

## 2010-12-04 MED ORDER — SULFAMETHOXAZOLE-TRIMETHOPRIM 800-160 MG PO TABS: 1.0000 | ORAL_TABLET | Freq: Two times a day (BID) | ORAL | Status: AC

## 2010-12-04 NOTE — Telephone Encounter (Signed)
Patient notified and will call back if abx do not help.

## 2010-12-04 NOTE — Telephone Encounter (Signed)
Patient was seen on Monday and he says that he was told to call back today if not feeling any better. He says that he has had no change in symptoms, still having a lot of congestion, cough, pressure around eyes. He is asking if he can get something called in to [address].

## 2010-12-04 NOTE — Telephone Encounter (Signed)
Day 10 of sinus sxs, likely bacterial.  pcn allergy.  Start bactrim ds bid x 10 days, take with food to avoid gi upset.  plz notify patient.

## 2011-01-01 NOTE — Op Note (Signed)
[place], PERRA                          ACCOUNT NO.:  [id number]   MEDICAL RECORD NO.:  [id number]                   PATIENT TYPE:  AMB   LOCATION:  ENDO                                 FACILITY:  [facility]   PHYSICIAN:  Anselmo Rod, M.D.               DATE OF BIRTH:  09/11/1955   DATE OF PROCEDURE:  10/21/2003  DATE OF DISCHARGE:                                 OPERATIVE REPORT   PROCEDURE:  Esophagogastroduodenoscopy with biopsies.   ENDOSCOPIST:  Anselmo Rod, M.D.   INSTRUMENT USED:  Olympus video panendoscope.   INDICATIONS FOR PROCEDURE:  A 55 year old white male with a history of  abdominal pain in the epigastric area and rectal bleeding, rule out peptic  ulcer disease, esophagitis, gastritis, etc.   PREPROCEDURE PREPARATION:  Informed consent was procured from the patient.  The patient was fasted for eight hours prior to the procedure.   PREPROCEDURE PHYSICAL EXAMINATION:  VITAL SIGNS:  Stable.  NECK:  Supple.  CHEST:  Clear to auscultation.  S1 and S2 regular.  ABDOMEN:  Soft with normal bowel sounds.   DESCRIPTION OF PROCEDURE:  The patient was placed in the left lateral  decubitus position and sedated with 75 mg of Demerol and 7.5 mg of Versed  intravenously.  Once the patient was adequately sedated and maintained on  low flow oxygen and continuous cardiac monitoring, the Olympus video  panendoscope was advanced through the mouthpiece, over the tongue, and into  the esophagus under direct vision.  The entire esophagus appeared normal  with no evidence of ring, stricture, mass, esophagitis, or Barrett's mucosa.  The scope was then advanced to the stomach and moderate diffuse gastritis  was noted throughout the gastric mucosa.  There was hemorrhagic duodenitis  noted in the duodenal bulb.  Small bowel distal to the bulb appeared normal.  Antral biopsy was done to rule out presence of H.pylori by pathology.  Retroflexion in the high cardia revealed a small  hiatal hernia.   IMPRESSION:  1. Normal appearing esophagus.  2. Small hiatal hernia seen on high retroflexion.  3. Diffuse gastritis, antral biopsies done for H.pylori.  4. Hemorrhagic duodenitis in the duodenal bulb.  5. Normal small bowel distal to the bulb up to 60 cm.   RECOMMENDATIONS:  Await pathology results.  PPI of choice.  Avoid  nonsteroidals for now.  Proceed with a colonoscopy at this time.  Further  recommendations can be made on an outpatient basis.                                               Anselmo Rod, M.D.    JNM/MEDQ  D:  10/21/2003  T:  10/22/2003  Job:  [id number]   cc:   Rosalyn Gess. Norins, M.D. [facility]

## 2011-01-01 NOTE — Op Note (Signed)
NAMEDAXTER, PAULE                          ACCOUNT NO.:  [id number]   MEDICAL RECORD NO.:  [id number]                   PATIENT TYPE:  AMB   LOCATION:  ENDO                                 FACILITY:  [facility]   PHYSICIAN:  Anselmo Rod, M.D.               DATE OF BIRTH:  1956/04/26   DATE OF PROCEDURE:  10/21/2003  DATE OF DISCHARGE:                                 OPERATIVE REPORT   PROCEDURE PERFORMED:  Colonoscopy.  DICTATION ENDED HERE                                               Anselmo Rod, M.D.    JNM/MEDQ  D:  10/21/2003  T:  10/22/2003  Job:  [id number]   cc:   Rosalyn Gess. Norins, M.D. [facility]

## 2011-01-01 NOTE — Op Note (Signed)
[name], Richard Yates                          ACCOUNT NO.:  [id number]   MEDICAL RECORD NO.:  [id number]                   PATIENT TYPE:  AMB   LOCATION:  ENDO                                 FACILITY:  [facility]   PHYSICIAN:  Anselmo Rod, M.D.               DATE OF BIRTH:  09/11/1955   DATE OF PROCEDURE:  10/21/2003  DATE OF DISCHARGE:                                 OPERATIVE REPORT   PROCEDURE:  Colonoscopy.   ENDOSCOPIST:  Charna Elizabeth, M.D.   INSTRUMENT USED:  Olympus video colonoscope.   INDICATIONS FOR PROCEDURE:  A 55 year old white male with history of rectal  bleeding.  Rule out colonic polyps, masses, etc.   PREPROCEDURE PREPARATION:  Informed consent was procured from the patient.  The patient was fasted for eight hours prior to the procedure and prepped  with a bottle of magnesium citrate and a gallon of GoLYTELY the night prior  to the procedure.   PREPROCEDURE PHYSICAL:  VITAL SIGNS:  The patient had stable vital signs.  NECK:  Supple.  CHEST:  Clear to auscultation.  CARDIAC:  S1 and S2 regular.  ABDOMEN:  Soft with normal bowel sounds.   DESCRIPTION OF PROCEDURE:  The patient was placed in the left lateral  decubitus position and sedated with an additional 25 mg of Demerol and 2.5  mg of Versed intravenously.  Once the patient was adequately sedated,  maintained on low-flow oxygen and continuous cardiac monitoring, the Olympus  video colonoscope was advanced from the rectum to the cecum.  The  appendiceal orifice and ileocecal valve were clearly visualized and  photographed.  Small internal hemorrhoids were seen on retroflexion in the  rectum.  No masses, polyps, erosions, ulcerations, or diverticula were seen.  The patient tolerated the procedure well without immediate complications.   IMPRESSION:  Normal colonoscopy up to the cecum some small internal  hemorrhoids.   RECOMMENDATIONS:  1. Continue high fiber diet.  2. Avoid nonsteroidals for now.  3.  Repeat CRC screening in the next 10 years unless the patient develops any     abnormal symptoms in the interim.  4. Outpatient followup in the next two weeks or earlier if need be.                                              Anselmo Rod, M.D.   JNM/MEDQ  D:  10/21/2003  T:  10/21/2003  Job:  [id number]   cc:   Rosalyn Gess. Norins, M.D. [facility]

## 2011-06-02 ENCOUNTER — Other Ambulatory Visit: Payer: Self-pay | Admitting: *Deleted

## 2011-06-02 MED ORDER — RANITIDINE HCL 150 MG PO TABS: 150.0000 mg | ORAL_TABLET | Freq: Two times a day (BID) | ORAL | Status: DC

## 2011-09-08 ENCOUNTER — Other Ambulatory Visit: Payer: Self-pay | Admitting: Dermatology

## 2012-05-12 ENCOUNTER — Other Ambulatory Visit: Payer: Self-pay | Admitting: Family Medicine

## 2012-05-12 MED ORDER — HYDROCORTISONE 2.5 % RE CREA: 1.0000 "application " | TOPICAL_CREAM | Freq: Two times a day (BID) | RECTAL | Status: DC

## 2012-05-12 NOTE — Telephone Encounter (Signed)
Go ahead and refil times one  Have him make appt later in the year if no appt is sched-thanks

## 2012-05-12 NOTE — Telephone Encounter (Signed)
Sent in refills electronically, and scheduled CPE and lab appt. With pt in Oct.

## 2012-05-12 NOTE — Telephone Encounter (Signed)
Pt wants meds refilled today because he is leaving for [place] on tomorrow, last OV 11/30/10 and no future appts or recent labs, is it ok to refill med?

## 2012-05-12 NOTE — Telephone Encounter (Signed)
Ok to refill? Has not been seen in over 1 year. 

## 2012-05-12 NOTE — Telephone Encounter (Signed)
Ok to refill last OV 11/30/10, no future appt an no recent labs

## 2012-05-29 ENCOUNTER — Telehealth: Payer: Self-pay | Admitting: Family Medicine

## 2012-05-29 DIAGNOSIS — E7800 Pure hypercholesterolemia, unspecified: Secondary | ICD-10-CM

## 2012-05-29 DIAGNOSIS — Z0000 Encounter for general adult medical examination without abnormal findings: Secondary | ICD-10-CM

## 2012-05-29 NOTE — Telephone Encounter (Signed)
Message copied by Judy Pimple on Mon May 29, 2012  9:21 PM ------      Message from: Alvina Chou      Created: Wed May 24, 2012  3:23 PM      Regarding: lab orders for Tuesday, 10.15.13       Patient is scheduled for CPX labs, please order future labs, Thanks , Camelia Eng

## 2012-05-30 ENCOUNTER — Other Ambulatory Visit (INDEPENDENT_AMBULATORY_CARE_PROVIDER_SITE_OTHER): Payer: Managed Care, Other (non HMO)

## 2012-05-30 DIAGNOSIS — E7800 Pure hypercholesterolemia, unspecified: Secondary | ICD-10-CM

## 2012-05-30 DIAGNOSIS — Z0000 Encounter for general adult medical examination without abnormal findings: Secondary | ICD-10-CM

## 2012-05-30 LAB — CBC WITH DIFFERENTIAL/PLATELET
Basophils Absolute: 0 10*3/uL (ref 0.0–0.1)
Eosinophils Absolute: 0.2 10*3/uL (ref 0.0–0.7)
Hemoglobin: 15.4 g/dL (ref 13.0–17.0)
Lymphocytes Relative: 24.9 % (ref 12.0–46.0)
MCHC: 32.9 g/dL (ref 30.0–36.0)
Monocytes Relative: 4.9 % (ref 3.0–12.0)
Neutro Abs: 5.1 10*3/uL (ref 1.4–7.7)
Platelets: 288 10*3/uL (ref 150.0–400.0)
RDW: 12.8 % (ref 11.5–14.6)

## 2012-05-30 LAB — COMPREHENSIVE METABOLIC PANEL
ALT: 21 U/L (ref 0–53)
AST: 22 U/L (ref 0–37)
Calcium: 9.3 mg/dL (ref 8.4–10.5)
Chloride: 102 mEq/L (ref 96–112)
Creatinine, Ser: 1.1 mg/dL (ref 0.4–1.5)
Sodium: 135 mEq/L (ref 135–145)
Total Bilirubin: 0.9 mg/dL (ref 0.3–1.2)
Total Protein: 7 g/dL (ref 6.0–8.3)

## 2012-05-30 LAB — TSH: TSH: 1.17 u[IU]/mL (ref 0.35–5.50)

## 2012-05-30 LAB — LIPID PANEL
HDL: 39.5 mg/dL (ref >39.00)
VLDL: 25.4 mg/dL (ref 0.0–40.0)

## 2012-06-07 ENCOUNTER — Encounter: Payer: Self-pay | Admitting: Family Medicine

## 2012-06-07 ENCOUNTER — Ambulatory Visit (INDEPENDENT_AMBULATORY_CARE_PROVIDER_SITE_OTHER): Payer: Managed Care, Other (non HMO) | Admitting: Family Medicine

## 2012-06-07 VITALS — BP 136/78 | HR 80 | Temp 97.8°F | Ht 69.0 in | Wt 186.5 lb

## 2012-06-07 DIAGNOSIS — Z23 Encounter for immunization: Secondary | ICD-10-CM

## 2012-06-07 DIAGNOSIS — E7800 Pure hypercholesterolemia, unspecified: Secondary | ICD-10-CM

## 2012-06-07 DIAGNOSIS — Z0000 Encounter for general adult medical examination without abnormal findings: Secondary | ICD-10-CM

## 2012-06-07 NOTE — Assessment & Plan Note (Signed)
Lipids are up significantly Disc goals for lipids and reasons to control them Rev labs with pt Rev low sat fat diet in detail  Has been intol of lipitor in past Lab and f/u 3 mo (given handout) - and if not imp perhaps try another statin

## 2012-06-07 NOTE — Patient Instructions (Addendum)
Avoid red meat/ fried foods/ egg yolks/ fatty breakfast meats/ butter, cheese and high fat dairy/ and shellfish   Schedule fasting labs for cholesterol in 3 mo with follow up after wards  Flu vaccine today  Think about a regular exercise program - 5 days per week optimally (work up to it)

## 2012-06-07 NOTE — Assessment & Plan Note (Signed)
Reviewed health habits including diet and exercise and skin cancer prevention Also reviewed health mt list, fam hx and immunizations  Will work on cholesterol  Flu vaccine today Rev wellness lab in detail Nl DRE

## 2012-06-07 NOTE — Progress Notes (Signed)
Subjective:    Patient ID: Richard Yates, male    DOB: Jan 06, 1956, 56 y.o.   MRN: [id number]  HPI Here for health maintenance exam and to review chronic medical problems    Is feeling ok overall  Nothing new going on except an itchy area on face - no lesion seen  Goes to derm yearly - will go in winter time  Is not good about wearing sunscreen   Wt is up 10 lb with bmi of 27 He has noticed that  Has been eating too much - snacking after supper - ? What lead to that  Is active but no regular exercise  He has noticed a drop in strength - he wants to start exercising (he has an elliptical)   Flu vaccine- will get today   Colonoscopy 1/05- no hx of polyps/ no fam hx - 10 year recall   No prostate problems  occ nocturia times one - that is all   Lab Results  Component Value Date   PSA 1.28 07/23/2009   PSA 1.36 02/14/2007   PSA 1.00 12/06/2005    Hyperlipidemia  Lab Results  Component Value Date   CHOL 219* 05/30/2012   CHOL 191 07/23/2009   CHOL 196 12/16/2008   Lab Results  Component Value Date   HDL 39.50 05/30/2012   HDL 44.00 07/23/2009   HDL 13.08* 12/16/2008   Lab Results  Component Value Date   LDLCALC 117* 07/23/2009   LDLCALC 142* 12/16/2008   Lab Results  Component Value Date   TRIG 127.0 05/30/2012   TRIG 148.0 07/23/2009   TRIG 98.0 12/16/2008   Lab Results  Component Value Date   CHOLHDL 6 05/30/2012   CHOLHDL 4 07/23/2009   CHOLHDL 6 12/16/2008   Lab Results  Component Value Date   LDLDIRECT 167.9 05/30/2012   LDLDIRECT 151.2 02/19/2008   LDLDIRECT 180.8 05/25/2007   got away from low chol diet - eating very poorly Just went on vacation and ate lots of shellfish   bp is ok    Chemistry      Component Value Date/Time   NA 135 05/30/2012 0855   K 4.8 05/30/2012 0855   CL 102 05/30/2012 0855   CO2 27 05/30/2012 0855   BUN 19 05/30/2012 0855   CREATININE 1.1 05/30/2012 0855      Component Value Date/Time   CALCIUM 9.3 05/30/2012 0855   ALKPHOS 65  05/30/2012 0855   AST 22 05/30/2012 0855   ALT 21 05/30/2012 0855   BILITOT 0.9 05/30/2012 0855       Glucose was 102   Patient Active Problem List  Diagnosis  . HYPERCHOLESTEROLEMIA, MILD  . HEMORRHOIDS  . ALLERGIC RHINITIS  . GERD  . GASTRITIS  . DERMATITIS, SEBORRHEIC  . RASH AND OTHER NONSPECIFIC SKIN ERUPTION  . Sinusitis  . Routine general medical examination at a health care facility   Past Medical History  Diagnosis Date  . Allergic rhinitis, cause unspecified   . Seborrheic dermatitis, unspecified   . Unspecified gastritis and gastroduodenitis without mention of hemorrhage   . Esophageal reflux   . Routine general medical examination at a health care facility   . Unspecified hemorrhoids without mention of complication   . Pure hypercholesterolemia   . Rash and other nonspecific skin eruption   . Special screening for malignant neoplasm of prostate   . DDD (degenerative disc disease) 10/2000   Past Surgical History  Procedure Date  . Mri   .  Esophagogastroduodenoscopy 10/2003    HH-had peptic bleeding, gastritis, neg H pylori  . Colonoscopy 10/2003   History  Substance Use Topics  . Smoking status: Never Smoker   . Smokeless tobacco: Not on file  . Alcohol Use: No   Family History  Problem Relation Age of Onset  . Diabetes Father     borderline; elevated PSA  . Hypertension Mother   . Hypertension      GM  . Prostate cancer Paternal Grandfather   . Stroke Maternal Grandfather    Allergies  Allergen Reactions  . Atorvastatin     REACTION: muscle aches/headache  . Penicillins     REACTION: rash   Current Outpatient Prescriptions on File Prior to Visit  Medication Sig Dispense Refill  . hydrocortisone (ANUSOL-HC) 2.5 % rectal cream Place 1 application rectally 2 (two) times daily.  30 g  0  . lansoprazole (PREVACID) 30 MG capsule Take 30 mg by mouth daily.      . Multiple Vitamin (MULTIVITAMIN) tablet Take 1 tablet by mouth daily.        .  Omega-3 Fatty Acids (FISH OIL) 1200 MG CAPS Take 2 capsules by mouth 2 (two) times daily.        . fluticasone (FLONASE) 50 MCG/ACT nasal spray 2 sprays by Nasal route daily.  16 g  1      Review of Systems Review of Systems  Constitutional: Negative for fever, appetite change, fatigue and unexpected weight change.  Eyes: Negative for pain and visual disturbance.  Respiratory: Negative for cough and shortness of breath.   Cardiovascular: Negative for cp or palpitations    Gastrointestinal: Negative for nausea, diarrhea and constipation.  Genitourinary: Negative for urgency and frequency.  Skin: Negative for pallor or rash   Neurological: Negative for weakness, light-headedness, numbness and headaches.  Hematological: Negative for adenopathy. Does not bruise/bleed easily.  Psychiatric/Behavioral: Negative for dysphoric mood. The patient is not nervous/anxious.         Objective:   Physical Exam  Constitutional: He appears well-developed and well-nourished. No distress.  HENT:  Head: Normocephalic and atraumatic.  Right Ear: External ear normal.  Left Ear: External ear normal.  Nose: Nose normal.  Mouth/Throat: Oropharynx is clear and moist.  Eyes: Conjunctivae normal and EOM are normal. Pupils are equal, round, and reactive to light. Right eye exhibits no discharge. Left eye exhibits no discharge.  Neck: Normal range of motion. Neck supple. No JVD present. Carotid bruit is not present. No thyromegaly present.  Cardiovascular: Normal rate, regular rhythm, normal heart sounds and intact distal pulses.  Exam reveals no gallop.   Pulmonary/Chest: Effort normal and breath sounds normal. No respiratory distress. He has no wheezes.  Abdominal: Soft. Bowel sounds are normal. He exhibits no distension, no abdominal bruit and no mass. There is no tenderness.  Genitourinary: Rectum normal and prostate normal. Prostate is not enlarged and not tender.  Musculoskeletal: He exhibits no edema and  no tenderness.  Lymphadenopathy:    He has no cervical adenopathy.  Neurological: He is alert. He has normal reflexes. No cranial nerve deficit. He exhibits normal muscle tone. Coordination normal.  Skin: Skin is warm and dry. No rash noted. No erythema. No pallor.       No skin findings on face  Psychiatric: He has a normal mood and affect.          Assessment & Plan:

## 2012-06-09 ENCOUNTER — Other Ambulatory Visit: Payer: Managed Care, Other (non HMO)

## 2012-09-07 ENCOUNTER — Other Ambulatory Visit: Payer: Self-pay

## 2012-09-08 ENCOUNTER — Other Ambulatory Visit (INDEPENDENT_AMBULATORY_CARE_PROVIDER_SITE_OTHER): Payer: Self-pay

## 2012-09-08 DIAGNOSIS — E7800 Pure hypercholesterolemia, unspecified: Secondary | ICD-10-CM

## 2012-09-08 LAB — LDL CHOLESTEROL, DIRECT: Direct LDL: 150.7 mg/dL

## 2012-09-08 LAB — LIPID PANEL
HDL: 38.1 mg/dL — ABNORMAL LOW (ref >39.00)
Triglycerides: 111 mg/dL (ref 0.0–149.0)

## 2012-09-15 ENCOUNTER — Encounter: Payer: Self-pay | Admitting: Family Medicine

## 2012-09-15 ENCOUNTER — Ambulatory Visit (INDEPENDENT_AMBULATORY_CARE_PROVIDER_SITE_OTHER): Payer: Self-pay | Admitting: Family Medicine

## 2012-09-15 VITALS — BP 126/86 | HR 92 | Temp 98.5°F | Ht 69.0 in | Wt 174.0 lb

## 2012-09-15 DIAGNOSIS — E785 Hyperlipidemia, unspecified: Secondary | ICD-10-CM

## 2012-09-15 NOTE — Patient Instructions (Addendum)
Great job with weight loss and better diet  Start working on exercise - 5 days per week for 30 min- work up to that  Schedule fasting lab in 3 months  If cholesterol does not come down to goal we will try crestor 5 mg every other day

## 2012-09-15 NOTE — Assessment & Plan Note (Signed)
Lipids down some but not at goal despite great diet (enough to loose 12 lb)  Urged to keep up the good work I think some of his high chol is from genetic causes Intol of lipitor  Wants to try more exercise before starting another statin  Will re check lab 3 mo - if not at goal try crestor 5 mg qod

## 2012-09-15 NOTE — Progress Notes (Signed)
Subjective:    Patient ID: Richard Yates, male    DOB: [birth year], 57 y.o.   MRN: [id number]  HPI Here for f/u of hyperlipidemia  Is doing very well   In past intol of lipitor - got aching in his legs  slt imp this time in LDL  Lab Results  Component Value Date   CHOL 214* 09/08/2012   CHOL 219* 05/30/2012   CHOL 191 07/23/2009   Lab Results  Component Value Date   HDL 38.10* 09/08/2012   HDL 39.50 05/30/2012   HDL 44.00 07/23/2009   Lab Results  Component Value Date   LDLCALC 117* 07/23/2009   LDLCALC 142* 12/16/2008   Lab Results  Component Value Date   TRIG 111.0 09/08/2012   TRIG 127.0 05/30/2012   TRIG 148.0 07/23/2009   Lab Results  Component Value Date   CHOLHDL 6 09/08/2012   CHOLHDL 6 05/30/2012   CHOLHDL 4 07/23/2009   Lab Results  Component Value Date   LDLDIRECT 150.7 09/08/2012   LDLDIRECT 167.9 05/30/2012   LDLDIRECT 151.2 02/19/2008    Is eating a lot better  Has lost 12 lb! - excited about that  Job is pretty physical and some extra exercise also   Has cut out fried food  Also cut back portion size  When eating out - chooses healthy options - lunch time is challenge    Patient Active Problem List  Diagnosis  . Hyperlipidemia  . HEMORRHOIDS  . ALLERGIC RHINITIS  . GERD  . GASTRITIS  . DERMATITIS, SEBORRHEIC  . RASH AND OTHER NONSPECIFIC SKIN ERUPTION  . Sinusitis  . Routine general medical examination at a health care facility   Past Medical History  Diagnosis Date  . Allergic rhinitis, cause unspecified   . Seborrheic dermatitis, unspecified   . Unspecified gastritis and gastroduodenitis without mention of hemorrhage   . Esophageal reflux   . Routine general medical examination at a health care facility   . Unspecified hemorrhoids without mention of complication   . Pure hypercholesterolemia   . Rash and other nonspecific skin eruption   . Special screening for malignant neoplasm of prostate   . DDD (degenerative disc disease) 10/2000    Past Surgical History  Procedure Date  . Mri   . Esophagogastroduodenoscopy 10/2003    HH-had peptic bleeding, gastritis, neg H pylori  . Colonoscopy 10/2003   History  Substance Use Topics  . Smoking status: Never Smoker   . Smokeless tobacco: Not on file  . Alcohol Use: No   Family History  Problem Relation Age of Onset  . Diabetes Father     borderline; elevated PSA  . Hypertension Mother   . Hypertension      GM  . Prostate cancer Paternal Grandfather   . Stroke Maternal Grandfather    Allergies  Allergen Reactions  . Atorvastatin     REACTION: muscle aches/headache  . Penicillins     REACTION: rash   Current Outpatient Prescriptions on File Prior to Visit  Medication Sig Dispense Refill  . Cholecalciferol (VITAMIN D-3 PO) Take 1 tablet by mouth daily.      . Flaxseed, Linseed, (FLAX SEED OIL PO) Take 1 tablet by mouth daily.      . hydrocortisone (ANUSOL-HC) 2.5 % rectal cream Place 1 application rectally 2 (two) times daily.  30 g  0  . lansoprazole (PREVACID) 30 MG capsule Take 30 mg by mouth daily.      . Multiple Vitamin (MULTIVITAMIN)  tablet Take 1 tablet by mouth daily.        . Omega-3 Fatty Acids (FISH OIL) 1200 MG CAPS Take 2 capsules by mouth 2 (two) times daily.        . fluticasone (FLONASE) 50 MCG/ACT nasal spray 2 sprays by Nasal route daily.  16 g  1    Review of SystemsReview of Systems  Constitutional: Negative for fever, appetite change, fatigue and unexpected weight change.  Eyes: Negative for pain and visual disturbance.  Respiratory: Negative for cough and shortness of breath.   Cardiovascular: Negative for cp or palpitations    Gastrointestinal: Negative for nausea, diarrhea and constipation.  Genitourinary: Negative for urgency and frequency.  Skin: Negative for pallor or rash   Neurological: Negative for weakness, light-headedness, numbness and headaches.  Hematological: Negative for adenopathy. Does not bruise/bleed easily.   Psychiatric/Behavioral: Negative for dysphoric mood. The patient is not nervous/anxious.         Objective:   Physical Exam  Constitutional: He appears well-developed and well-nourished. No distress.  HENT:  Head: Normocephalic and atraumatic.  Eyes: Conjunctivae normal and EOM are normal. Pupils are equal, round, and reactive to light.  Neck: Normal range of motion. Neck supple. Carotid bruit is not present.  Cardiovascular: Normal rate, regular rhythm, normal heart sounds and intact distal pulses.  Exam reveals no gallop.   Pulmonary/Chest: Effort normal and breath sounds normal. No respiratory distress.  Abdominal: He exhibits no abdominal bruit.  Musculoskeletal: He exhibits no edema.  Lymphadenopathy:    He has no cervical adenopathy.  Skin: Skin is warm and dry. No rash noted. No erythema.  Psychiatric: He has a normal mood and affect.          Assessment & Plan:

## 2012-10-05 ENCOUNTER — Other Ambulatory Visit: Payer: Self-pay

## 2012-12-13 ENCOUNTER — Other Ambulatory Visit (INDEPENDENT_AMBULATORY_CARE_PROVIDER_SITE_OTHER): Payer: 59

## 2012-12-13 DIAGNOSIS — E785 Hyperlipidemia, unspecified: Secondary | ICD-10-CM

## 2012-12-13 LAB — LDL CHOLESTEROL, DIRECT: Direct LDL: 162.2 mg/dL

## 2012-12-13 LAB — LIPID PANEL
Cholesterol: 215 mg/dL — ABNORMAL HIGH (ref 0–200)
HDL: 38.6 mg/dL — ABNORMAL LOW (ref >39.00)
Total CHOL/HDL Ratio: 6
Triglycerides: 136 mg/dL (ref 0.0–149.0)
VLDL: 27.2 mg/dL (ref 0.0–40.0)

## 2012-12-18 ENCOUNTER — Other Ambulatory Visit: Payer: Self-pay | Admitting: *Deleted

## 2012-12-18 MED ORDER — ROSUVASTATIN CALCIUM 5 MG PO TABS: 5.0000 mg | ORAL_TABLET | ORAL | Status: DC

## 2013-01-31 ENCOUNTER — Telehealth: Payer: Self-pay

## 2013-01-31 NOTE — Telephone Encounter (Signed)
Cancel lab for now and let me know how he feels after being off crestor-thanks

## 2013-01-31 NOTE — Telephone Encounter (Signed)
Pt left v/m; pt has been having leg cramping; pt has stopped Crestor and wants to be off med for couple of weeks to see if leg cramps go away; pt is scheduled for lipid labs on 02/06/13; should pt cancel lab appt until see if goes back on Crestor or not.Please advise.

## 2013-01-31 NOTE — Telephone Encounter (Signed)
Left voicemail letting pt know I canceled lab appt and stop crestor and updated [place] in a week or two to let us know if being off Crestor helped with leg cramps

## 2013-02-06 ENCOUNTER — Other Ambulatory Visit: Payer: 59

## 2013-02-27 NOTE — Telephone Encounter (Signed)
Pt left v/m since stopping Crestor the leg cramping has stopped. Pt wants to know if should restart Crestor to see if leg cramping related to back problem or not.Please advise.

## 2013-02-27 NOTE — Telephone Encounter (Signed)
Take crestor for 3-7 days and if pain returns- stop it and let me know - at that point I would be fairly certain it is a crestor side eff

## 2013-02-28 NOTE — Telephone Encounter (Signed)
Pt notified to restart Crestor for a few days and if pain returns let us know, pt verbalized understanding

## 2013-03-28 ENCOUNTER — Encounter: Payer: Self-pay | Admitting: Family Medicine

## 2013-03-28 ENCOUNTER — Ambulatory Visit (INDEPENDENT_AMBULATORY_CARE_PROVIDER_SITE_OTHER): Payer: 59 | Admitting: Family Medicine

## 2013-03-28 VITALS — BP 126/76 | HR 73 | Temp 98.5°F | Ht 69.0 in | Wt 174.2 lb

## 2013-03-28 DIAGNOSIS — E785 Hyperlipidemia, unspecified: Secondary | ICD-10-CM

## 2013-03-28 DIAGNOSIS — L290 Pruritus ani: Secondary | ICD-10-CM | POA: Insufficient documentation

## 2013-03-28 MED ORDER — CLOTRIMAZOLE-BETAMETHASONE 1-0.05 % EX CREA: TOPICAL_CREAM | Freq: Two times a day (BID) | CUTANEOUS | Status: DC

## 2013-03-28 NOTE — Patient Instructions (Signed)
Keep the itchy area very clean and dry  Try the lotrisone cream up to twice daily (no more than 14 days in a row) If no improvement in 1-2 weeks let me know  The healing fissure could also cause itching Stop crestor - I think it is causing your muscle pain

## 2013-03-28 NOTE — Progress Notes (Signed)
Subjective:    Patient ID: Richard Yates, male    DOB: [birth year], 57 y.o.   MRN: [id number]  HPI Here with some rectal symptoms  ? Hemorrhoid issues   Went to have f/u with Dr Loreta Ave for GERD She examined him  Had a fissure (thinks she may have tx him with a nitroglycerin topical product?) That healed Now itching is really a problem  Has a skin tag    occ a little burning and stinging  No blood in stool   He tried some mometasone topically and it helped the itch   No known exp to pinworms    gerd is doing better now   He is back on crestor and it is causing muscle pain (front and back of the legs)- similar to what he had with the lipitor  Stopped and started it to be sure that was the problem Trying to eat better  Patient Active Problem List   Diagnosis Date Noted  . Anal itching 03/28/2013  . Routine general medical examination at a health care facility 05/29/2012  . RASH AND OTHER NONSPECIFIC SKIN ERUPTION 07/07/2009  . ALLERGIC RHINITIS 11/13/2008  . DERMATITIS, SEBORRHEIC 08/29/2008  . HEMORRHOIDS 06/07/2008  . Hyperlipidemia 02/02/2007  . GERD 02/02/2007  . GASTRITIS 02/02/2007   Past Medical History  Diagnosis Date  . Allergic rhinitis, cause unspecified   . Seborrheic dermatitis, unspecified   . Unspecified gastritis and gastroduodenitis without mention of hemorrhage   . Esophageal reflux   . Routine general medical examination at a health care facility   . Unspecified hemorrhoids without mention of complication   . Pure hypercholesterolemia   . Rash and other nonspecific skin eruption   . Special screening for malignant neoplasm of prostate   . DDD (degenerative disc disease) 10/2000   Past Surgical History  Procedure Laterality Date  . Mri    . Esophagogastroduodenoscopy  10/2003    HH-had peptic bleeding, gastritis, neg H pylori  . Colonoscopy  10/2003   History  Substance Use Topics  . Smoking status: Never Smoker   . Smokeless tobacco: Not  on file  . Alcohol Use: No   Family History  Problem Relation Age of Onset  . Diabetes Father     borderline; elevated PSA  . Hypertension Mother   . Hypertension      GM  . Prostate cancer Paternal Grandfather   . Stroke Maternal Grandfather    Allergies  Allergen Reactions  . Atorvastatin     REACTION: muscle aches/headache  . Crestor [Rosuvastatin]     Leg muscle pain   . Penicillins     REACTION: rash   Current Outpatient Prescriptions on File Prior to Visit  Medication Sig Dispense Refill  . lansoprazole (PREVACID) 30 MG capsule Take 30 mg by mouth daily.      . Multiple Vitamin (MULTIVITAMIN) tablet Take 1 tablet by mouth daily.        . Omega-3 Fatty Acids (FISH OIL) 1200 MG CAPS Take 1 capsule by mouth 2 (two) times daily.       . hydrocortisone (ANUSOL-HC) 2.5 % rectal cream Place 1 application rectally 2 (two) times daily.  30 g  0   No current facility-administered medications on file prior to visit.     Review of Systems Review of Systems  Constitutional: Negative for fever, appetite change, fatigue and unexpected weight change.  Eyes: Negative for pain and visual disturbance.  Respiratory: Negative for cough and shortness of breath.  Cardiovascular: Negative for cp or palpitations    Gastrointestinal: Negative for nausea, diarrhea and constipation.  Genitourinary: Negative for urgency and frequency.  Skin: Negative for pallor or rash  pos for itching in the anal area MS[facility] pos for muscle aches on statin- neg for acute joint changes  Neurological: Negative for weakness, light-headedness, numbness and headaches.  Hematological: Negative for adenopathy. Does not bruise/bleed easily.  Psychiatric/Behavioral: Negative for dysphoric mood. The patient is not nervous/anxious.         Objective:   Physical Exam  Constitutional: He appears well-developed and well-nourished. No distress.  HENT:  Head: Normocephalic and atraumatic.  Eyes: Conjunctivae and EOM are  normal. Pupils are equal, round, and reactive to light.  Neck: Neck supple.  Cardiovascular: Normal rate and regular rhythm.   Pulmonary/Chest: Effort normal and breath sounds normal.  Abdominal: Soft. Bowel sounds are normal. He exhibits no distension and no mass. There is no tenderness.  Genitourinary: Rectum normal. Rectal exam shows no external hemorrhoid, no fissure, no tenderness and anal tone normal. Guaiac negative stool.  Nl appearing skin  No excoriation One small skin tag at 6:0  Musculoskeletal: He exhibits no tenderness.  Neurological: He is alert.  Skin: Skin is warm and dry. No rash noted. No erythema. No pallor.  Psychiatric: He has a normal mood and affect.          Assessment & Plan:

## 2013-03-29 NOTE — Assessment & Plan Note (Signed)
Intol of 2 statins Will continue to watch diet carefully for sat fats

## 2013-03-29 NOTE — Assessment & Plan Note (Signed)
Without evident ext hemorrhoids and small skin tag  Will tx with lotrisone cream (no more than 14 days at a time) Disc hygine and need to keep area dry Will update

## 2013-07-10 ENCOUNTER — Ambulatory Visit (INDEPENDENT_AMBULATORY_CARE_PROVIDER_SITE_OTHER): Payer: 59 | Admitting: Family Medicine

## 2013-07-10 ENCOUNTER — Encounter: Payer: Self-pay | Admitting: Family Medicine

## 2013-07-10 VITALS — BP 130/78 | HR 70 | Temp 98.2°F

## 2013-07-10 DIAGNOSIS — R350 Frequency of micturition: Secondary | ICD-10-CM | POA: Insufficient documentation

## 2013-07-10 DIAGNOSIS — R300 Dysuria: Secondary | ICD-10-CM

## 2013-07-10 DIAGNOSIS — Z125 Encounter for screening for malignant neoplasm of prostate: Secondary | ICD-10-CM | POA: Insufficient documentation

## 2013-07-10 DIAGNOSIS — Z23 Encounter for immunization: Secondary | ICD-10-CM

## 2013-07-10 DIAGNOSIS — H04129 Dry eye syndrome of unspecified lacrimal gland: Secondary | ICD-10-CM

## 2013-07-10 DIAGNOSIS — H04121 Dry eye syndrome of right lacrimal gland: Secondary | ICD-10-CM | POA: Insufficient documentation

## 2013-07-10 DIAGNOSIS — N400 Enlarged prostate without lower urinary tract symptoms: Secondary | ICD-10-CM

## 2013-07-10 LAB — POCT URINALYSIS DIPSTICK
Blood, UA: NEGATIVE
Glucose, UA: NEGATIVE
Nitrite, UA: NEGATIVE
Protein, UA: NEGATIVE
Spec Grav, UA: 1.015
Urobilinogen, UA: 0.2
pH, UA: 6

## 2013-07-10 MED ORDER — TAMSULOSIN HCL 0.4 MG PO CAPS: 0.4000 mg | ORAL_CAPSULE | Freq: Every day | ORAL | Status: DC

## 2013-07-10 NOTE — Progress Notes (Signed)
Pre-visit discussion using our clinic review tool. No additional management support is needed unless otherwise documented below in the visit note.  

## 2013-07-10 NOTE — Patient Instructions (Signed)
Labs for psa today  Try flomax 1 pill daily Update me in about 2 weeks re: how it works- if no improvement we will refer you to a urologist for further eval  Start using the systane eye drops again and if no improvement get back to the eye doctor

## 2013-07-10 NOTE — Progress Notes (Signed)
Subjective:    Patient ID: Richard Yates, male    DOB: [birth year], 57 y.o.   MRN: [id number]  HPI Here with a urinary problem and also R eye irritation Going on "for a while" now  Urgency- then needs to find a bathroom quick  Frequency  No flow issues / wide open and empties his bladder (he thinks) No pain  No blood in urine   During the day (on a bad day once per hour)  Father - enlarged No prostate cancer in family    Just changed routes at [employer] available  He drinks a lot of tea at lunchtime   At night- he gets up once - or none   Lab Results  Component Value Date   PSA 1.28 07/23/2009   PSA 1.36 02/14/2007   PSA 1.00 12/06/2005     Has had a dry feeling R eye for a while  Told by opthy this summer to use warm compresses/ systane eye drops and also cleanse area with baby shampoo  Symptoms persist  Eye itself feels dry - seldom red   Patient Active Problem List   Diagnosis Date Noted  . Anal itching 03/28/2013  . Routine general medical examination at a health care facility 05/29/2012  . RASH AND OTHER NONSPECIFIC SKIN ERUPTION 07/07/2009  . ALLERGIC RHINITIS 11/13/2008  . DERMATITIS, SEBORRHEIC 08/29/2008  . HEMORRHOIDS 06/07/2008  . Hyperlipidemia 02/02/2007  . GERD 02/02/2007  . GASTRITIS 02/02/2007   Past Medical History  Diagnosis Date  . Allergic rhinitis, cause unspecified   . Seborrheic dermatitis, unspecified   . Unspecified gastritis and gastroduodenitis without mention of hemorrhage   . Esophageal reflux   . Routine general medical examination at a health care facility   . Unspecified hemorrhoids without mention of complication   . Pure hypercholesterolemia   . Rash and other nonspecific skin eruption   . Special screening for malignant neoplasm of prostate   . DDD (degenerative disc disease) 10/2000   Past Surgical History  Procedure Laterality Date  . Mri    . Esophagogastroduodenoscopy  10/2003    HH-had peptic bleeding,  gastritis, neg H pylori  . Colonoscopy  10/2003   History  Substance Use Topics  . Smoking status: Never Smoker   . Smokeless tobacco: Not on file  . Alcohol Use: No   Family History  Problem Relation Age of Onset  . Diabetes Father     borderline; elevated PSA  . Hypertension Mother   . Hypertension      GM  . Prostate cancer Paternal Grandfather   . Stroke Maternal Grandfather    Allergies  Allergen Reactions  . Atorvastatin     REACTION: muscle aches/headache  . Crestor [Rosuvastatin]     Leg muscle pain   . Penicillins     REACTION: rash   Current Outpatient Prescriptions on File Prior to Visit  Medication Sig Dispense Refill  . clotrimazole-betamethasone (LOTRISONE) cream Apply topically 2 (two) times daily. To affected area as needed  30 g  1  . lansoprazole (PREVACID) 30 MG capsule Take 30 mg by mouth daily.      . Multiple Vitamin (MULTIVITAMIN) tablet Take 1 tablet by mouth daily.        . Omega-3 Fatty Acids (FISH OIL) 1200 MG CAPS Take 1 capsule by mouth 2 (two) times daily.       Marland Kitchen SALINE NASAL SPRAY NA Place into the nose as needed.  No current facility-administered medications on file prior to visit.      Review of Systems Review of Systems  Constitutional: Negative for fever, appetite change, fatigue and unexpected weight change.  Eyes: Negative for pain and visual disturbance. pos for dry feeling in R eye without redness / pain or vision change  Respiratory: Negative for cough and shortness of breath.   Cardiovascular: Negative for cp or palpitations    Gastrointestinal: Negative for nausea, diarrhea and constipation.  Genitourinary: pos for urgency and frequency without incontinence/ neg for pain to urinate or pelvic pain or discharge  Skin: Negative for pallor or rash   Neurological: Negative for weakness, light-headedness, numbness and headaches.  Hematological: Negative for adenopathy. Does not bruise/bleed easily.  Psychiatric/Behavioral:  Negative for dysphoric mood. The patient is not nervous/anxious.         Objective:   Physical Exam  Constitutional: He appears well-developed and well-nourished. No distress.  HENT:  Head: Normocephalic and atraumatic.  Nose: Nose normal.  Mouth/Throat: Oropharynx is clear and moist.  Eyes: Conjunctivae and EOM are normal. Pupils are equal, round, and reactive to light. Right eye exhibits no discharge. Left eye exhibits no discharge. No scleral icterus.  Eyes appear normal  Nl appearing lids    Neck: Normal range of motion. Neck supple.  Cardiovascular: Normal rate and regular rhythm.   Pulmonary/Chest: Effort normal and breath sounds normal.  Abdominal: Soft. Bowel sounds are normal. He exhibits no distension and no mass. There is no tenderness. There is no rebound and no guarding.  No suprapubic tenderness or fullness   No cva tenderness   Genitourinary: Rectum normal. Rectal exam shows no external hemorrhoid, no fissure, no mass, no tenderness and anal tone normal. Prostate is enlarged.  Mild prostate enlargement No nodules Symmetric/ firm /nt     Musculoskeletal: He exhibits no edema.  Lymphadenopathy:    He has no cervical adenopathy.  Neurological: He is alert. He has normal reflexes.  Skin: Skin is warm and dry. No rash noted. No erythema.  Psychiatric: He has a normal mood and affect.          Assessment & Plan:

## 2013-07-11 NOTE — Assessment & Plan Note (Signed)
Suspect this is cause of frequent or urgent urination - but this also could be an overactive bladder No hx of DM or polydipsia  psa today  ua clear  Trial of flomax  urol ref if no imp

## 2013-07-11 NOTE — Assessment & Plan Note (Signed)
No apparent source seen  Will use the lubricating eye drops  Adv to return to opthy for full exam

## 2013-07-11 NOTE — Assessment & Plan Note (Signed)
Suspect BPH Trial of flomax PSA Ref to urol if no imp

## 2013-07-13 ENCOUNTER — Encounter: Payer: Self-pay | Admitting: *Deleted

## 2013-08-24 ENCOUNTER — Telehealth: Payer: Self-pay

## 2013-08-24 DIAGNOSIS — N400 Enlarged prostate without lower urinary tract symptoms: Secondary | ICD-10-CM

## 2013-08-24 DIAGNOSIS — R350 Frequency of micturition: Secondary | ICD-10-CM

## 2013-08-24 NOTE — Telephone Encounter (Signed)
Stop the flomax I want to ref him to urology for further eval - ? agreeable If fever or abd pain reoccur let me know

## 2013-08-24 NOTE — Telephone Encounter (Signed)
Pt left v/m; pt said since taking flomax cannot see improvement with frequent urination. Pt said frequent urination is better when not drinks carbonated drinks or tea. On 08/19/13 pt had 100.5 fever with lower abd discomfort; fever and discomfort lasted one day. Pt wants to know if can stop Flomax or what does Dr Milinda Antisower suggest.Please advise.

## 2013-08-24 NOTE — Telephone Encounter (Signed)
Pt notified of Dr. Royden Purlower's comments/recommendations pt agrees with referral to urology and would like to go [place], I advise pt that Marion/Linda will call to schedule appt

## 2013-08-24 NOTE — Telephone Encounter (Signed)
Referral to urology done

## 2013-11-06 ENCOUNTER — Telehealth: Payer: Self-pay

## 2013-11-06 DIAGNOSIS — Z1211 Encounter for screening for malignant neoplasm of colon: Secondary | ICD-10-CM

## 2013-11-06 NOTE — Telephone Encounter (Signed)
Pt received note that it is time for pt to have colonoscopy; Dr Loreta AveMann has done in the past and pt wants to know if Dr Milinda Antisower thinks Dr Loreta AveMann is OK to do colonoscopy or does Dr Milinda Antisower have another preferred physician for colonoscopy. Pt request cb.

## 2013-11-07 DIAGNOSIS — Z1211 Encounter for screening for malignant neoplasm of colon: Secondary | ICD-10-CM | POA: Insufficient documentation

## 2013-11-07 NOTE — Telephone Encounter (Signed)
I like Dr Loreta AveMann I will go ahead and do the referral

## 2013-11-08 NOTE — Telephone Encounter (Signed)
Left voicemail requesting pt to call office 

## 2013-11-09 NOTE — Telephone Encounter (Signed)
Pt left v/m returning call and request cb. 

## 2013-11-09 NOTE — Telephone Encounter (Signed)
Pt notified Dr. Royden Purlower's said Dr. Loreta AveMann is okay and she put referral in

## 2014-04-16 ENCOUNTER — Telehealth: Payer: Self-pay | Admitting: *Deleted

## 2014-04-16 NOTE — Telephone Encounter (Signed)
Pt scheduled for tomorrow 04/17/14 at 10:15am

## 2014-04-16 NOTE — Telephone Encounter (Signed)
We need to eval for hyperglycemia/early DM  Please make appt to be seen

## 2014-04-16 NOTE — Telephone Encounter (Signed)
Patient left a voicemail stating that he was at Dr. Kenna Gilbert office last week. Patient stated that they did lab work and his sugar was 148. Patient was advised to call and let Dr. Milinda Antis know this.

## 2014-04-17 ENCOUNTER — Encounter: Payer: Self-pay | Admitting: Family Medicine

## 2014-04-17 ENCOUNTER — Ambulatory Visit (INDEPENDENT_AMBULATORY_CARE_PROVIDER_SITE_OTHER): Payer: 59 | Admitting: Family Medicine

## 2014-04-17 VITALS — BP 110/78 | HR 78 | Temp 98.5°F | Ht 69.0 in | Wt 178.8 lb

## 2014-04-17 DIAGNOSIS — R739 Hyperglycemia, unspecified: Secondary | ICD-10-CM

## 2014-04-17 DIAGNOSIS — R7303 Prediabetes: Secondary | ICD-10-CM | POA: Insufficient documentation

## 2014-04-17 DIAGNOSIS — R7309 Other abnormal glucose: Secondary | ICD-10-CM

## 2014-04-17 LAB — HEMOGLOBIN A1C: HEMOGLOBIN A1C: 5.9 % (ref 4.6–6.5)

## 2014-04-17 LAB — GLUCOSE, POCT (MANUAL RESULT ENTRY): POC Glucose: 116 mg/dl — AB (ref 70–99)

## 2014-04-17 NOTE — Progress Notes (Signed)
Pre visit review using our clinic review tool, if applicable. No additional management support is needed unless otherwise documented below in the visit note. 

## 2014-04-17 NOTE — Progress Notes (Signed)
Subjective:    Patient ID: Richard Yates, male    DOB: 01-Aug-1956, 58 y.o.   MRN: [id number]  HPI Here for hyperglycemia   At Dr Kenna Gilbert office - he had a blood sugar of 148 non fasting  He had eaten 18 donuts in 3 days before that reading   Father has borderline DM  Not overwt    Today- glucose is 116  Ate a bowl of cereal   He has frequent urination -tx by urology (given vesicare and went to PT also)-is off med now  Not excessively thirsty - does drink a lot of fluids  Drinks mostly water  occ slt sweetened tes   He does love dessert and sweets  He eats a fair amt of sweets  Not enough exercise - has active job and yard to care for  (sitting more at work than he used to)  [employer] is up 4 lb  bmi 26    Patient Active Problem List   Diagnosis Date Noted  . Hyperglycemia 04/17/2014  . Colon cancer screening 11/07/2013  . Dry eye of right side 07/10/2013  . Frequent urination 07/10/2013  . BPH (benign prostatic hyperplasia) 07/10/2013  . Anal itching 03/28/2013  . Routine general medical examination at a health care facility 05/29/2012  . RASH AND OTHER NONSPECIFIC SKIN ERUPTION 07/07/2009  . ALLERGIC RHINITIS 11/13/2008  . DERMATITIS, SEBORRHEIC 08/29/2008  . HEMORRHOIDS 06/07/2008  . Hyperlipidemia 02/02/2007  . GERD 02/02/2007  . GASTRITIS 02/02/2007   Past Medical History  Diagnosis Date  . Allergic rhinitis, cause unspecified   . Seborrheic dermatitis, unspecified   . Unspecified gastritis and gastroduodenitis without mention of hemorrhage   . Esophageal reflux   . Routine general medical examination at a health care facility   . Unspecified hemorrhoids without mention of complication   . Pure hypercholesterolemia   . Rash and other nonspecific skin eruption   . Special screening for malignant neoplasm of prostate   . DDD (degenerative disc disease) 10/2000   Past Surgical History  Procedure Laterality Date  . Mri    . Esophagogastroduodenoscopy  10/2003     HH-had peptic bleeding, gastritis, neg H pylori  . Colonoscopy  10/2003   History  Substance Use Topics  . Smoking status: Never Smoker   . Smokeless tobacco: Not on file  . Alcohol Use: No   Family History  Problem Relation Age of Onset  . Diabetes Father     borderline; elevated PSA  . Hypertension Mother   . Hypertension      GM  . Prostate cancer Paternal Grandfather   . Stroke Maternal Grandfather    Allergies  Allergen Reactions  . Atorvastatin     REACTION: muscle aches/headache  . Crestor [Rosuvastatin]     Leg muscle pain   . Penicillins     REACTION: rash   Current Outpatient Prescriptions on File Prior to Visit  Medication Sig Dispense Refill  . lansoprazole (PREVACID) 30 MG capsule Take 30 mg by mouth daily.      . Multiple Vitamin (MULTIVITAMIN) tablet Take 1 tablet by mouth daily.        . Omega-3 Fatty Acids (FISH OIL) 1200 MG CAPS Take 1 capsule by mouth 2 (two) times daily.       Marland Kitchen SALINE NASAL SPRAY NA Place into the nose as needed.       No current facility-administered medications on file prior to visit.    Review of Systems Review  of Systems  Constitutional: Negative for fever, appetite change, fatigue and unexpected weight change.  Eyes: Negative for pain and visual disturbance.  Respiratory: Negative for cough and shortness of breath.   Cardiovascular: Negative for cp or palpitations    Gastrointestinal: Negative for nausea, diarrhea and constipation.  Genitourinary: Negative for urgency and frequency.  Skin: Negative for pallor or rash   Neurological: Negative for weakness, light-headedness, numbness and headaches.  Hematological: Negative for adenopathy. Does not bruise/bleed easily.  Psychiatric/Behavioral: Negative for dysphoric mood. The patient is not nervous/anxious.         Objective:   Physical Exam  Constitutional: He appears well-developed and well-nourished. No distress.  overwt and well app  HENT:  Head: Normocephalic and  atraumatic.  Eyes: Conjunctivae and EOM are normal. Pupils are equal, round, and reactive to light.  Neck: Normal range of motion. Neck supple. No JVD present. Carotid bruit is not present. No thyromegaly present.  Cardiovascular: Normal rate, regular rhythm, normal heart sounds and intact distal pulses.   Pulmonary/Chest: Effort normal and breath sounds normal. No respiratory distress. He has no wheezes. He has no rales.  Abdominal: He exhibits no distension. There is no tenderness. There is no rebound.  Musculoskeletal: He exhibits no edema.  Lymphadenopathy:    He has no cervical adenopathy.  Neurological: He is alert. He has normal reflexes. No cranial nerve deficit. He exhibits normal muscle tone. Coordination normal.  Nl sens in feet and hands   Skin: Skin is warm and dry. No pallor.  Psychiatric: He has a normal mood and affect.          Assessment & Plan:   Problem List Items Addressed This Visit     Other   Hyperglycemia - Primary     Glucose of 148 and then 116 -non fasting  A lot of sugar in diet and bmi of 26  Disc DM and hyperglycemia in great detail and lifestyle changes to prevent it  Does have fam hx  Lab today for A1C and update   Handout given as well  Reassuring exam  No symptoms     Relevant Orders      Glucose (CBG) (Completed)      Hemoglobin A1c (Completed)

## 2014-04-17 NOTE — Assessment & Plan Note (Signed)
Glucose of 148 and then 116 -non fasting  A lot of sugar in diet and bmi of 26  Disc DM and hyperglycemia in great detail and lifestyle changes to prevent it  Does have fam hx  Lab today for A1C and update   Handout given as well  Reassuring exam  No symptoms

## 2014-04-17 NOTE — Patient Instructions (Signed)
Eat a lower sugar diet  Avoid sweets and also drink water and unsweetened tea  Try to exercise 5 days per week Do not overdue starches like bread/pasta/rice

## 2014-04-18 ENCOUNTER — Encounter: Payer: Self-pay | Admitting: *Deleted

## 2014-04-19 ENCOUNTER — Other Ambulatory Visit: Payer: Self-pay | Admitting: Family Medicine

## 2014-04-19 NOTE — Telephone Encounter (Signed)
Last office visit 04/17/2014.  Not on current medication list.  Ok to refill?

## 2014-04-19 NOTE — Telephone Encounter (Signed)
Pt left v/m to ck on status of refill for clotrimazole-betamethosone cream; [facility].

## 2014-04-19 NOTE — Telephone Encounter (Signed)
I think he uses it intermittently Please call in one refill

## 2014-06-05 ENCOUNTER — Encounter: Payer: Self-pay | Admitting: Family Medicine

## 2014-06-05 LAB — HM COLONOSCOPY

## 2014-09-24 ENCOUNTER — Ambulatory Visit: Payer: 59

## 2014-09-24 ENCOUNTER — Telehealth: Payer: Self-pay

## 2014-09-24 NOTE — Telephone Encounter (Signed)
I recommend mucinex DM for cough  Drink fluids Flu shot ok if he has no fever  F/u if no further improvement in cough

## 2014-09-24 NOTE — Telephone Encounter (Signed)
Notified patient with Dr Royden Purlower's comments. Patient verbalized understanding.

## 2014-09-24 NOTE — Telephone Encounter (Signed)
Pt came in today for nurse visit flu shot; pt said since 08/25/14 pt has had prod cough with greenish phlegm; for the last week cough has been more often. When blows nose has clear to yellow mucus. Pt has no fever, S/T,earache, wheezing or SOB. Pt concerned that he is coughing more this last week. Pt has been taking OTC advil cold,nyquil and mucinex. Pt wants to know if should be seen since coughing for so long or should pt try different med or does pt need to do nothing different and can get flu shot. Pt request cb [facility].

## 2014-11-20 ENCOUNTER — Telehealth: Payer: Self-pay

## 2014-11-20 NOTE — Telephone Encounter (Signed)
Pt traveling to [place] and has appt tomorrow morning at health dept travel advisory and pt wanted to know last tetanus; advised on immunization sheet historically listed as 05/16/2005.pt voiced understanding.

## 2014-11-21 ENCOUNTER — Ambulatory Visit: Payer: 59

## 2014-11-21 ENCOUNTER — Telehealth: Payer: Self-pay

## 2014-11-21 NOTE — Telephone Encounter (Signed)
Pt notified as instructed and has nurse visit for 11/21/14 at 2:30 pm. Immunization form for signature in Dr Lianne Bushyuncan's in box.

## 2014-11-21 NOTE — Telephone Encounter (Signed)
Form done. Thanks. 

## 2014-11-21 NOTE — Telephone Encounter (Signed)
Have him come in today to get tdap and HAV.  Thanks. I'll sign the order.

## 2014-11-21 NOTE — Telephone Encounter (Signed)
Pt left v/m; [facility] was at [facility] today and got yellow fever and typhoid shot; pt going to [place]. Pt did not get tdap or Hep A shot. Pt was advised should get shot within 24 hours or would have to wait for 28 days. Pt wants to know if can get Hep A and t dap today. Pt request cb [phone] ASAP.

## 2014-11-22 ENCOUNTER — Ambulatory Visit: Payer: 59

## 2014-11-22 NOTE — Telephone Encounter (Signed)
Fyi, pt canceled his appt yesterday.

## 2014-11-24 NOTE — Telephone Encounter (Signed)
Aware- Dr Para Marchuncan is also aware

## 2015-02-13 ENCOUNTER — Ambulatory Visit (INDEPENDENT_AMBULATORY_CARE_PROVIDER_SITE_OTHER): Payer: 59

## 2015-02-13 DIAGNOSIS — Z23 Encounter for immunization: Secondary | ICD-10-CM

## 2015-05-16 ENCOUNTER — Encounter: Payer: Self-pay | Admitting: Family Medicine

## 2015-05-16 ENCOUNTER — Ambulatory Visit (INDEPENDENT_AMBULATORY_CARE_PROVIDER_SITE_OTHER)
Admission: RE | Admit: 2015-05-16 | Discharge: 2015-05-16 | Disposition: A | Discharge status: Home / Self Care | Payer: 59 | Source: Ambulatory Visit | Attending: Family Medicine | Admitting: Family Medicine

## 2015-05-16 ENCOUNTER — Ambulatory Visit (INDEPENDENT_AMBULATORY_CARE_PROVIDER_SITE_OTHER): Payer: 59 | Admitting: Family Medicine

## 2015-05-16 VITALS — BP 144/86 | HR 73 | Temp 98.2°F | Ht 69.0 in | Wt 173.5 lb

## 2015-05-16 DIAGNOSIS — M542 Cervicalgia: Secondary | ICD-10-CM | POA: Diagnosis not present

## 2015-05-16 DIAGNOSIS — R200 Anesthesia of skin: Secondary | ICD-10-CM | POA: Insufficient documentation

## 2015-05-16 DIAGNOSIS — R202 Paresthesia of skin: Secondary | ICD-10-CM

## 2015-05-16 DIAGNOSIS — G8929 Other chronic pain: Secondary | ICD-10-CM

## 2015-05-16 NOTE — Patient Instructions (Signed)
Xray of cervical spine today  I wonder if you may have some some degenerative changes that could affect your arms If the wrist spints help at night -continue  ? Investigate memory foam pillow Ibuprofen as needed

## 2015-05-16 NOTE — Progress Notes (Signed)
Pre visit review using our clinic review tool, if applicable. No additional management support is needed unless otherwise documented below in the visit note. 

## 2015-05-16 NOTE — Progress Notes (Signed)
Subjective:    Patient ID: Richard Yates, male    DOB: March 31, 1956, 59 y.o.   MRN: [id number]  HPI Having problems with neck and forearms   For years  Usually comes and goes  This time it is "not getting better"  Of note - he joined a gym several mo ago and exercise helps some   Neck symptoms : wakes up with aching in the back of his neck occ  Hurts neck and shoulder when he lies on his L side  It tends to switch sides  Sleeps with a horseshoe neck pillow  No prev neck dx or injury   Forearms - hurt  Back of forearms feel tingly or burned (sensitive to the touch)  Comes and goes like neck pain - occ wakes up with it   Fingers -started bothering him in June - L thumb and index finger feel kind of numb (non dom hand)  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 Hyperglycemia 04/17/2014  . Colon cancer screening 11/07/2013  . Dry eye of right side 07/10/2013  . Frequent urination 07/10/2013  . BPH (benign prostatic hyperplasia) 07/10/2013  . Anal itching 03/28/2013  . Routine general medical examination at a health care facility 05/29/2012  . RASH AND OTHER NONSPECIFIC SKIN ERUPTION 07/07/2009  . ALLERGIC RHINITIS 11/13/2008  . DERMATITIS, SEBORRHEIC 08/29/2008  . HEMORRHOIDS 06/07/2008  . Hyperlipidemia 02/02/2007  . GERD 02/02/2007  . GASTRITIS 02/02/2007   Past Medical History  Diagnosis Date  . Allergic rhinitis, cause unspecified   . Seborrheic dermatitis, unspecified   . Unspecified gastritis and gastroduodenitis without mention of hemorrhage   . Esophageal reflux   . Routine general medical examination at a health care facility   . Unspecified hemorrhoids without mention of complication   . Pure hypercholesterolemia   . Rash and other nonspecific skin  eruption   . Special screening for malignant neoplasm of prostate   . DDD (degenerative disc disease) 10/2000   Past Surgical History  Procedure Laterality Date  . Mri    . Esophagogastroduodenoscopy  10/2003    HH-had peptic bleeding, gastritis, neg H pylori  . Colonoscopy  10/2003   Social History  Substance Use Topics  . Smoking status: Never Smoker   . Smokeless tobacco: None  . Alcohol Use: No   Family History  Problem Relation Age of Onset  . Diabetes Father     borderline; elevated PSA  . Hypertension Mother   . Hypertension      GM  . Prostate cancer Paternal Grandfather   . Stroke Maternal Grandfather    Allergies  Allergen Reactions  . Atorvastatin     REACTION: muscle aches/headache  . Crestor [Rosuvastatin]     Leg muscle pain   . Penicillins     REACTION: rash   Current Outpatient Prescriptions on File Prior to Visit  Medication Sig Dispense Refill  . clotrimazole-betamethasone (LOTRISONE) cream APPLY TO AFFECTED AREA AS NEEDED 30 g 0  . lansoprazole (PREVACID) 30 MG capsule Take 30 mg by mouth daily.    . Multiple Vitamin (MULTIVITAMIN) tablet Take 1 tablet by mouth daily.      . Omega-3 Fatty Acids (FISH OIL) 1200 MG CAPS Take  1 capsule by mouth 2 (two) times daily.     Marland Kitchen SALINE NASAL SPRAY NA Place into the nose as needed.     No current facility-administered medications on file prior to visit.     Review of Systems Review of Systems  Constitutional: Negative for fever, appetite change, fatigue and unexpected weight change.  Eyes: Negative for pain and visual disturbance.  Respiratory: Negative for cough and shortness of breath.   Cardiovascular: Negative for cp or palpitations    Gastrointestinal: Negative for nausea, diarrhea and constipation.  Genitourinary: Negative for urgency and frequency.  Skin: Negative for pallor or rash   Neurological: Negative for weakness, light-headedness, and headaches.  Hematological: Negative for adenopathy. Does  not bruise/bleed easily.  Psychiatric/Behavioral: Negative for dysphoric mood. The patient is not nervous/anxious.         Objective:   Physical Exam  Constitutional: He appears well-developed and well-nourished. No distress.  HENT:  Head: Normocephalic and atraumatic.  Eyes: Conjunctivae and EOM are normal. Pupils are equal, round, and reactive to light. No scleral icterus.  Neck: Normal range of motion. Neck supple. No JVD present. Carotid bruit is not present. No tracheal deviation present. No thyromegaly present.  Cardiovascular: Normal rate and regular rhythm.   Pulmonary/Chest: Effort normal and breath sounds normal.  Musculoskeletal: He exhibits tenderness. He exhibits no edema.  Some muscular neck tenderness  Pain on full ext and tilt either direction  Mild crepitus   No elbow tenderness or swelling  Nl rom wrists   Pos tinel on L rist  Lymphadenopathy:    He has no cervical adenopathy.  Neurological: He is alert. He has normal strength and normal reflexes. He displays no atrophy and no tremor. No cranial nerve deficit or sensory deficit. He exhibits normal muscle tone. Coordination normal.  Nl sens to sharp and soft touch over hands and arms today  Skin: Skin is warm and dry. No rash noted. No erythema. No pallor.  Psychiatric: He has a normal mood and affect.          Assessment & Plan:   Problem List Items Addressed This Visit      Other   Neck pain, chronic - Primary    Acute and chronic  Unsure if rel to arm pain (? Radicular) Enc use of ice and heat  Continue ibuprofen and shop for a supportive foam pillow  CS film today      Relevant Orders   DG Cervical Spine Complete (Completed)   Numbness and tingling in hands    Left hand - with ? Pos tinels sign / neg Phalen   Both forearms - a funny feeling   ? If this could be radicular from neck or peripheral cause No weakness or other symptoms   CS xray today inst to use L wrist splint at night         Relevant Orders   DG Cervical Spine Complete (Completed)

## 2015-05-16 NOTE — Assessment & Plan Note (Addendum)
Left hand - with ? Pos tinels sign / neg Phalen   Both forearms - a funny feeling   ? If this could be radicular from neck or peripheral cause No weakness or other symptoms   CS xray today inst to use L wrist splint at night

## 2015-05-18 ENCOUNTER — Telehealth: Payer: Self-pay | Admitting: Family Medicine

## 2015-05-18 DIAGNOSIS — R202 Paresthesia of skin: Secondary | ICD-10-CM

## 2015-05-18 NOTE — Telephone Encounter (Signed)
-----   Message from Shon Millet, [place] sent at 05/16/2015  4:27 PM EDT ----- Pt notified of xray results and Dr. Royden Purl comments/recommendations. Pt said he does want a referral to neuro but he is going out of town for a week so he will call back when he returns to get appt scheduled if you put referral in

## 2015-05-18 NOTE — Telephone Encounter (Signed)
I did the referral 

## 2015-05-18 NOTE — Assessment & Plan Note (Addendum)
Acute and chronic  Unsure if rel to arm pain (? Radicular) Enc use of ice and heat  Continue ibuprofen and shop for a supportive foam pillow  CS film today

## 2015-06-27 ENCOUNTER — Encounter: Payer: Self-pay | Admitting: Neurology

## 2015-06-27 ENCOUNTER — Ambulatory Visit (INDEPENDENT_AMBULATORY_CARE_PROVIDER_SITE_OTHER): Payer: 59 | Admitting: Neurology

## 2015-06-27 VITALS — BP 160/90 | HR 67 | Ht 69.0 in | Wt 175.1 lb

## 2015-06-27 DIAGNOSIS — R202 Paresthesia of skin: Secondary | ICD-10-CM

## 2015-06-27 DIAGNOSIS — R292 Abnormal reflex: Secondary | ICD-10-CM | POA: Diagnosis not present

## 2015-06-27 DIAGNOSIS — M542 Cervicalgia: Secondary | ICD-10-CM

## 2015-06-27 DIAGNOSIS — G5603 Carpal tunnel syndrome, bilateral upper limbs: Secondary | ICD-10-CM

## 2015-06-27 NOTE — Patient Instructions (Addendum)
1.  NCS/EMG of the upper extremities.  We will call you with the results 2.  Consider neck physiotherapy and/or MRI cervical spine as the next step depending on the results 3.  Return to clinic in 2-3 months

## 2015-06-27 NOTE — Progress Notes (Signed)
[facility] Clinic Note - Initial Visit   Date: 06/27/2015  Richard Yates MRN: [id number] DOB: [birth year]   Dear Dr. Milinda Antis:  Thank you for your kind referral of Richard Yates for consultation of neck pain and bilateral forearm pain. Although his history is well known to you, please allow [place] to reiterate it for the purpose of our medical record. The patient was accompanied to the clinic by self.    History of Present Illness: Richard Yates is a 59 y.o. right-handed Caucasian male with GERD and diet-controlled hyperlipidemia presenting for evaluation of neck pain and bilateral forearm tingling.    He reports having problems with his neck for 25+ years, described as intermittent achy pain which can be aggravated by abrupt movement and cause sharp pain.  He has also noticed bilateral achy pain of the forearm which has been present over the past 10-15 years.  Pain would usually occur for about a month of the year.  It is associated with tingling of the hands especially the first three fingers, which is worse on the left.  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  Wrist splints at nighttime can provide some relief. He feels that his left forearm is more sensitive as if there is a mild sunburn, especially with light touch, such as air conditioning.  He denies any weakness or paresthesias of the legs.   Out-side paper records, electronic medical record, and images have been reviewed where available and summarized as:  XR cervical spine 05/16/2015:  Negative cervical spine radiographs.  Lab Results  Component Value Date   TSH 1.17 05/30/2012   Lab Results  Component Value Date   HGBA1C 5.9 04/17/2014    Past Medical History  Diagnosis Date  . Allergic rhinitis, cause unspecified   . Seborrheic dermatitis, unspecified   . Unspecified gastritis and gastroduodenitis without mention of hemorrhage   . Esophageal  reflux   . Routine general medical examination at a health care facility   . Unspecified hemorrhoids without mention of complication   . Pure hypercholesterolemia   . Rash and other nonspecific skin eruption   . Special screening for malignant neoplasm of prostate   . DDD (degenerative disc disease) 10/2000    Past Surgical History  Procedure Laterality Date  . Mri    . Esophagogastroduodenoscopy  10/2003    HH-had peptic bleeding, gastritis, neg H pylori  . Colonoscopy  10/2003     Medications:  Outpatient Encounter Prescriptions as of 06/27/2015  Medication Sig  . clotrimazole-betamethasone (LOTRISONE) cream APPLY TO AFFECTED AREA AS NEEDED  . lansoprazole (PREVACID) 30 MG capsule Take 30 mg by mouth daily.  . Multiple Vitamin (MULTIVITAMIN) tablet Take 1 tablet by mouth daily.    . Omega-3 Fatty Acids (FISH OIL) 1200 MG CAPS Take 1 capsule by mouth 2 (two) times daily.   Marland Kitchen SALINE NASAL SPRAY NA Place into the nose as needed.   No facility-administered encounter medications on file as of 06/27/2015.     Allergies:  Allergies  Allergen Reactions  . Atorvastatin     REACTION: muscle aches/headache  . Crestor [Rosuvastatin]     Leg muscle pain   . Penicillins     REACTION: rash    Family History: Family History  Problem Relation Age of Onset  . Diabetes Father     borderline; elevated PSA  . Hypertension Mother   . Hypertension  GM  . Prostate cancer Paternal Grandfather   . Stroke Maternal Grandfather     Social History: Social History  Substance Use Topics  . Smoking status: Never Smoker   . Smokeless tobacco: Never Used  . Alcohol Use: No   Social History   Social History Narrative   Lives with wife in a one story home.  Has 3 children.  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  Education: high school.    Review of Systems:  CONSTITUTIONAL: No fevers, chills, night sweats, or weight loss.   EYES: No visual changes or eye pain ENT: No hearing  changes.  No history of nose bleeds.   RESPIRATORY: No cough, wheezing and shortness of breath.   CARDIOVASCULAR: Negative for chest pain, and palpitations.   GI: Negative for abdominal discomfort, blood in stools or black stools.  No recent change in bowel habits.   GU:  No history of incontinence.   MUSCLOSKELETAL: +history of joint pain or swelling.  No myalgias.   SKIN: Negative for lesions, rash, and itching.   HEMATOLOGY/ONCOLOGY: Negative for prolonged bleeding, bruising easily, and swollen nodes.  No history of cancer.   ENDOCRINE: Negative for cold or heat intolerance, polydipsia or goiter.   PSYCH:  No depression or anxiety symptoms.   NEURO: As Above.   Vital Signs:  BP 160/90 mmHg  Pulse 67  Ht 5\' 9"  (1.753 m)  Wt 175 lb 2 oz (79.436 kg)  BMI 25.85 kg/m2  SpO2 98%   General Medical Exam:   General:  Well appearing, comfortable.   Eyes/ENT: see cranial nerve examination.   Neck: No masses appreciated.  Full range of motion without tenderness.  No carotid bruits. Respiratory:  Clear to auscultation, good air entry bilaterally.   Cardiac:  Regular rate and rhythm, no murmur.   Extremities:  No deformities, edema, or skin discoloration.  Skin:  No rashes or lesions.  Neurological Exam: MENTAL STATUS including orientation to time, place, person, recent and remote memory, attention span and concentration, language, and fund of knowledge is normal.  Speech is not dysarthric.  CRANIAL NERVES: II:  No visual field defects.  Unremarkable fundi.   III-IV-VI: Pupils equal round and reactive to light.  Normal conjugate, extra-ocular eye movements in all directions of gaze.  No nystagmus.  No ptosis.   V:  Normal facial sensation.    VII:  Normal facial symmetry and movements.  No pathologic facial reflexes.  VIII:  Normal hearing and vestibular function.   IX-X:  Normal palatal movement.   XI:  Normal shoulder shrug and head rotation.   XII:  Normal tongue strength and range  of motion, no deviation or fasciculation.  MOTOR:  No atrophy, fasciculations or abnormal movements.  No pronator drift.  Tone is normal.    Right Upper Extremity:    Left Upper Extremity:    Deltoid  5/5   Deltoid  5/5   Biceps  5/5   Biceps  5/5   Triceps  5/5   Triceps  5/5   Wrist extensors  5/5   Wrist extensors  5/5   Wrist flexors  5/5   Wrist flexors  5/5   Finger extensors  5/5   Finger extensors  5/5   Finger flexors  5/5   Finger flexors  5/5   Dorsal interossei  5/5   Dorsal interossei  5/5   Abductor pollicis  5/5   Abductor pollicis  5/5   Tone (Ashworth scale)  0  Tone (Ashworth scale)  0   Right Lower Extremity:    Left Lower Extremity:    Hip flexors  5/5   Hip flexors  5/5   Hip extensors  5/5   Hip extensors  5/5   Knee flexors  5/5   Knee flexors  5/5   Knee extensors  5/5   Knee extensors  5/5   Dorsiflexors  5/5   Dorsiflexors  5/5   Plantarflexors  5/5   Plantarflexors  5/5   Toe extensors  5/5   Toe extensors  5/5   Toe flexors  5/5   Toe flexors  5/5   Tone (Ashworth scale)  0  Tone (Ashworth scale)  0   MSRs:  Right                                                                 Left brachioradialis 3+  brachioradialis 3+  biceps 3+  biceps 3+  triceps 3+  triceps 3+  patellar 3+  patellar 3+  ankle jerk 2+  ankle jerk 2+  Hoffman no  Hoffman no  plantar response down  plantar response down   SENSORY:  Normal and symmetric perception of light touch, pinprick, vibration, and proprioception.  Romberg's sign absent.  Tinel's negative at the wrist and elbow.   COORDINATION/GAIT: Normal finger-to- nose-finger and heel-to-shin.  Intact rapid alternating movements bilaterally.  Able to rise from a chair without using arms.  Gait narrow based and stable. Tandem and stressed gait intact.    IMPRESSION: Mr. Laday is a 59 year-old gentleman presenting for evaluation of bilateral arm pain, paresthesias, and neck discomfort.  His exam shows brisk and symmetric  reflexes throughout without other associated upper motor neuron findings, suggestive of cervical canal stenosis.  With his neck and forearm pain, he very well may have C6-7 sensory radiculopathy. He may also have superimposed carpal tunnel syndrome especially has hand paresthesias improve with wearing wrist splints.  The pathology of both processes and management options were discussed.  To better localize his symptoms, NCS/EMG of the upper extremities will be done.   He had many questions which I answered to the best of my ability.  PLAN/RECOMMENDATIONS:  1.  NCS/EMG of the upper extremities.   2.  Consider neck physiotherapy and/or MRI cervical spine as the next step depending on the results 3.  Return to clinic in 2-3 months   The duration of this appointment visit was 45 minutes of face-to-face time with the patient.  Greater than 50% of this time was spent in counseling, explanation of diagnosis, planning of further management, and coordination of care.   Thank you for allowing me to participate in patient's care.  If I can answer any additional questions, I would be pleased to do so.    Sincerely,    Donika K. Allena Katz, DO

## 2015-07-07 ENCOUNTER — Telehealth: Payer: Self-pay | Admitting: Neurology

## 2015-07-07 NOTE — Telephone Encounter (Signed)
Pt called with concerns about the EMG//call back @ [phone]

## 2015-07-08 NOTE — Telephone Encounter (Signed)
Left message for patient to call me back. 

## 2015-07-18 ENCOUNTER — Ambulatory Visit (INDEPENDENT_AMBULATORY_CARE_PROVIDER_SITE_OTHER): Payer: 59 | Admitting: Neurology

## 2015-07-18 ENCOUNTER — Other Ambulatory Visit: Payer: Self-pay | Admitting: *Deleted

## 2015-07-18 ENCOUNTER — Other Ambulatory Visit: Payer: Self-pay | Admitting: Neurology

## 2015-07-18 DIAGNOSIS — R202 Paresthesia of skin: Secondary | ICD-10-CM

## 2015-07-18 DIAGNOSIS — R292 Abnormal reflex: Secondary | ICD-10-CM

## 2015-07-18 DIAGNOSIS — M542 Cervicalgia: Secondary | ICD-10-CM | POA: Diagnosis not present

## 2015-07-18 DIAGNOSIS — T1590XA Foreign body on external eye, part unspecified, unspecified eye, initial encounter: Secondary | ICD-10-CM

## 2015-07-18 DIAGNOSIS — G5603 Carpal tunnel syndrome, bilateral upper limbs: Secondary | ICD-10-CM

## 2015-07-18 NOTE — Procedures (Signed)
[facility] Neurology  [address], [address]  [place], [place] [address] Tel: [phone] Fax:  [phone] Test Date:  07/18/2015  Patient: Richard Yates DOB: Jun 08, 1956 Physician: Nita Sickle, DO  Sex: Male Height:  Ref Phys: Nita Sickle, DO  ID#: [id number] Temp: 33.3C Technician: Judie Petit. Dean   Patient Complaints: This is a 59 year old gentleman referred for evaluation of bilateral forearm paresthesias and chronic neck pain.  NCV & EMG Findings: Extensive electrodiagnostic testing of the right upper extremity and additional studies of the left shows: 1. Bilateral median, ulnar, and palmar sensory responses are within normal limits. 2. Bilateral median and ulnar motor responses are within normal limits. 3. There is no evidence of active or chronic motor axon loss changes affecting any of the tested muscles. Motor unit configuration and recruitment pattern is within normal limits.  Impression: This is a normal study of the upper extremities. In particular, there is no evidence of a cervical radiculopathy or carpal tunnel syndrome.   ___________________________ Nita Sickle, DO    Nerve Conduction Studies Anti Sensory Summary Table   Site NR Peak (ms) Norm Peak (ms) P-T Amp (V) Norm P-T Amp  Left Median Anti Sensory (2nd Digit)  33.3C  Wrist    3.1 <3.6 27.8 >15  Right Median Anti Sensory (2nd Digit)  33.3C  Wrist    3.3 <3.6 16.4 >15  Left Ulnar Anti Sensory (5th Digit)  33.3C  Site 2    2.9  17.9   Right Ulnar Anti Sensory (5th Digit)  33.3C  Wrist    2.8 <3.1 14.5 >10   Motor Summary Table   Site NR Onset (ms) Norm Onset (ms) O-P Amp (mV) Norm O-P Amp Site1 Site2 Delta-0 (ms) Dist (cm) Vel (m/s) Norm Vel (m/s)  Left Median Motor (Abd Poll Brev)  33.3C  Wrist    3.8 <4.0 8.3 >6 Elbow Wrist 4.0 24.0 60 >50  Elbow    7.8  7.9         Right Median Motor (Abd Poll Brev)  33.3C  Wrist    3.8 <4.0 7.8 >6 Elbow Wrist 4.6 26.0 57 >50  Elbow    8.4  7.5          Left Ulnar Motor (Abd Dig Minimi)  33.3C  Wrist    2.9 <3.1 7.9 >7 B Elbow Wrist 3.5 20.0 57 >50  B Elbow    6.4  7.1  A Elbow B Elbow 1.7 10.0 59 >50  A Elbow    8.1  7.1         Right Ulnar Motor (Abd Dig Minimi)  33.3C  Wrist    3.0 <3.1 9.1 >7 B Elbow Wrist 3.5 19.0 54 >50  B Elbow    6.5  8.2  A Elbow B Elbow 1.6 10.0 63 >50  A Elbow    8.1  7.9          Comparison Summary Table   Site NR Peak (ms) Norm Peak (ms) P-T Amp (V) Site1 Site2 Delta-P (ms) Norm Delta (ms)  Left Median/Ulnar Palm Comparison (Wrist - 8cm)  33.3C  Median Palm    2.1 <2.2 104.4 Median Palm Ulnar Palm 0.0   Ulnar Palm    2.1 <2.2 15.7      Right Median/Ulnar Palm Comparison (Wrist - 8cm)  33.3C  Median Palm    2.1 <2.2 93.5 Median Palm Ulnar Palm 0.0   Ulnar Palm    2.1 <2.2 12.8  EMG   Side Muscle Ins Act Fibs Psw Fasc Number Recrt Dur Dur. Amp Amp. Poly Poly. Comment  Right 1stDorInt Nml Nml Nml Nml Nml Nml Nml Nml Nml Nml Nml Nml N/A  Right Ext Indicis Nml Nml Nml Nml Nml Nml Nml Nml Nml Nml Nml Nml N/A  Right PronatorTeres Nml Nml Nml Nml Nml Nml Nml Nml Nml Nml Nml Nml N/A  Right Biceps Nml Nml Nml Nml Nml Nml Nml Nml Nml Nml Nml Nml N/A  Right Triceps Nml Nml Nml Nml Nml Nml Nml Nml Nml Nml Nml Nml N/A  Right Deltoid Nml Nml Nml Nml Nml Nml Nml Nml Nml Nml Nml Nml N/A  Left 1stDorInt Nml Nml Nml Nml Nml Nml Nml Nml Nml Nml Nml Nml N/A  Left Ext Indicis Nml Nml Nml Nml Nml Nml Nml Nml Nml Nml Nml Nml N/A  Left PronatorTeres Nml Nml Nml Nml Nml Nml Nml Nml Nml Nml Nml Nml N/A  Left Biceps Nml Nml Nml Nml Nml Nml Nml Nml Nml Nml Nml Nml N/A  Left Deltoid Nml Nml Nml Nml Nml Nml Nml Nml Nml Nml Nml Nml N/A  Left Triceps Nml Nml Nml Nml Nml Nml Nml Nml Nml Nml Nml Nml N/A      Waveforms:

## 2015-07-29 ENCOUNTER — Telehealth: Payer: Self-pay | Admitting: *Deleted

## 2015-07-29 NOTE — Telephone Encounter (Signed)
Called patient and informed him that I cancelled his MRI for Thursday since I have not been able to get it approved.  I will continue to try for PA and call him back to reschedule.

## 2015-07-31 ENCOUNTER — Inpatient Hospital Stay: Admission: RE | Admit: 2015-07-31 | Payer: 59 | Source: Ambulatory Visit

## 2016-08-20 ENCOUNTER — Ambulatory Visit (INDEPENDENT_AMBULATORY_CARE_PROVIDER_SITE_OTHER): Payer: 59 | Admitting: Family Medicine

## 2016-08-20 ENCOUNTER — Encounter: Payer: Self-pay | Admitting: Family Medicine

## 2016-08-20 VITALS — BP 116/78 | HR 78 | Temp 98.3°F | Ht 68.5 in | Wt 179.0 lb

## 2016-08-20 DIAGNOSIS — Z0000 Encounter for general adult medical examination without abnormal findings: Secondary | ICD-10-CM | POA: Diagnosis not present

## 2016-08-20 DIAGNOSIS — Z1211 Encounter for screening for malignant neoplasm of colon: Secondary | ICD-10-CM | POA: Diagnosis not present

## 2016-08-20 DIAGNOSIS — Z23 Encounter for immunization: Secondary | ICD-10-CM

## 2016-08-20 DIAGNOSIS — E7800 Pure hypercholesterolemia, unspecified: Secondary | ICD-10-CM

## 2016-08-20 DIAGNOSIS — N3941 Urge incontinence: Secondary | ICD-10-CM | POA: Insufficient documentation

## 2016-08-20 DIAGNOSIS — R739 Hyperglycemia, unspecified: Secondary | ICD-10-CM | POA: Diagnosis not present

## 2016-08-20 DIAGNOSIS — K219 Gastro-esophageal reflux disease without esophagitis: Secondary | ICD-10-CM

## 2016-08-20 DIAGNOSIS — N400 Enlarged prostate without lower urinary tract symptoms: Secondary | ICD-10-CM

## 2016-08-20 DIAGNOSIS — Z125 Encounter for screening for malignant neoplasm of prostate: Secondary | ICD-10-CM

## 2016-08-20 LAB — CBC WITH DIFFERENTIAL/PLATELET
Basophils Absolute: 0.1 10*3/uL (ref 0.0–0.1)
Basophils Relative: 0.6 % (ref 0.0–3.0)
EOS ABS: 0.3 10*3/uL (ref 0.0–0.7)
Eosinophils Relative: 2.9 % (ref 0.0–5.0)
HCT: 44.1 % (ref 39.0–52.0)
Hemoglobin: 15 g/dL (ref 13.0–17.0)
LYMPHS ABS: 1.6 10*3/uL (ref 0.7–4.0)
Lymphocytes Relative: 18.4 % (ref 12.0–46.0)
MCHC: 34 g/dL (ref 30.0–36.0)
MCV: 88.7 fl (ref 78.0–100.0)
MONOS PCT: 5.7 % (ref 3.0–12.0)
Monocytes Absolute: 0.5 10*3/uL (ref 0.1–1.0)
NEUTROS ABS: 6.5 10*3/uL (ref 1.4–7.7)
NEUTROS PCT: 72.4 % (ref 43.0–77.0)
PLATELETS: 318 10*3/uL (ref 150.0–400.0)
RBC: 4.97 Mil/uL (ref 4.22–5.81)
RDW: 12.7 % (ref 11.5–15.5)
WBC: 9 10*3/uL (ref 4.0–10.5)

## 2016-08-20 LAB — LIPID PANEL
CHOL/HDL RATIO: 6
CHOLESTEROL: 258 mg/dL — AB (ref 0–200)
HDL: 45.4 mg/dL (ref >39.00)
LDL CALC: 183 mg/dL — AB (ref 0–99)
NonHDL: 212.17
Triglycerides: 147 mg/dL (ref 0.0–149.0)
VLDL: 29.4 mg/dL (ref 0.0–40.0)

## 2016-08-20 LAB — COMPREHENSIVE METABOLIC PANEL
ALK PHOS: 76 U/L (ref 39–117)
ALT: 19 U/L (ref 0–53)
AST: 20 U/L (ref 0–37)
Albumin: 4.7 g/dL (ref 3.5–5.2)
BILIRUBIN TOTAL: 1 mg/dL (ref 0.2–1.2)
BUN: 16 mg/dL (ref 6–23)
CO2: 31 mEq/L (ref 19–32)
CREATININE: 1.08 mg/dL (ref 0.40–1.50)
Calcium: 10.1 mg/dL (ref 8.4–10.5)
Chloride: 98 mEq/L (ref 96–112)
GFR: 74.24 mL/min (ref >60.00)
GLUCOSE: 90 mg/dL (ref 70–99)
Potassium: 5 mEq/L (ref 3.5–5.1)
Sodium: 135 mEq/L (ref 135–145)
TOTAL PROTEIN: 7.1 g/dL (ref 6.0–8.3)

## 2016-08-20 LAB — HEMOGLOBIN A1C: Hgb A1c MFr Bld: 5.9 % (ref 4.6–6.5)

## 2016-08-20 LAB — PSA: PSA: 1.94 ng/mL (ref 0.10–4.00)

## 2016-08-20 LAB — TSH: TSH: 1.36 u[IU]/mL (ref 0.35–4.50)

## 2016-08-20 NOTE — Assessment & Plan Note (Signed)
Has seen urology  No longer on vesicare  Doing ok - will return prn Re assuring DRE today

## 2016-08-20 NOTE — Assessment & Plan Note (Signed)
Disc goals for lipids and reasons to control them Rev labs with pt (from last draw)  Will check lipid profile today  Rev low sat fat diet in detail -will try to do better and pack lunch so he does not eat fast food

## 2016-08-20 NOTE — Assessment & Plan Note (Signed)
Per pt we do not have his most recent colonoscopy report from Dr Loreta AveMann  Will send for that  He thinks he is up to date

## 2016-08-20 NOTE — Progress Notes (Signed)
Subjective:    Patient ID: Richard Yates, male    DOB: 15-Jul-1956, 61 y.o.   MRN: [id number]  HPI Here for health maintenance exam and to review chronic medical problems   Doing well  Feeling good overall   Has a toe that hurts- thinks he cut the nail too short   Wt Readings from Last 3 Encounters:  08/20/16 179 lb (81.2 kg)  06/27/15 175 lb 2 oz (79.4 kg)  05/16/15 173 lb 8 oz (78.7 kg)  wt is up a bit  Feels like he eats well except for lunch (eats out/fast food)  Last year went to the gym 2-3 times per week- but not since mid 2017- got off track in the summer from outdoor activity and work delivering mail  Has an elliptical machine  bmi is 26.8  Hep C/HIV screening  Does not want to screen- low risk    Colonoscopy 1/05-normal  Thinks he has had one since then- not sure, will send to Dr Loreta Ave for record   Flu shot- today   Tetanus shot 6/16  Hx of BPH Lab Results  Component Value Date   PSA 1.56 07/10/2013   PSA 1.28 07/23/2009   PSA 1.36 02/14/2007   no problems  Nocturia 1-2 times per night - that is the same for the past 5 years  During the day - no change / went to see urology several years ago and had some physical therapy for urinary control and incontinence (urge)  GF paternal - had prostate cancer     Hx of hyperlipidemia Lab Results  Component Value Date   CHOL 215 (H) 12/13/2012   HDL 38.60 (L) 12/13/2012   LDLCALC 117 (H) 07/23/2009   LDLDIRECT 162.2 12/13/2012   TRIG 136.0 12/13/2012   CHOLHDL 6 12/13/2012  expects it will be high-too much fast food    Hx of hyperglycemia Lab Results  Component Value Date   HGBA1C 5.9 04/17/2014    Patient Active Problem List   Diagnosis Date Noted  . Urge incontinence 08/20/2016  . Neck pain, chronic 05/16/2015  . Numbness and tingling in hands 05/16/2015  . Hyperglycemia 04/17/2014  . Colon cancer screening 11/07/2013  . Dry eye of right side 07/10/2013  . Frequent urination 07/10/2013  . BPH  (benign prostatic hyperplasia) 07/10/2013  . Anal itching 03/28/2013  . Routine general medical examination at a health care facility 05/29/2012  . RASH AND OTHER NONSPECIFIC SKIN ERUPTION 07/07/2009  . ALLERGIC RHINITIS 11/13/2008  . DERMATITIS, SEBORRHEIC 08/29/2008  . HEMORRHOIDS 06/07/2008  . Hyperlipidemia 02/02/2007  . GERD 02/02/2007  . GASTRITIS 02/02/2007   Past Medical History:  Diagnosis Date  . Allergic rhinitis, cause unspecified   . DDD (degenerative disc disease) 10/2000  . Esophageal reflux   . Pure hypercholesterolemia   . Rash and other nonspecific skin eruption   . Routine general medical examination at a health care facility   . Seborrheic dermatitis, unspecified   . Special screening for malignant neoplasm of prostate   . Unspecified gastritis and gastroduodenitis without mention of hemorrhage   . Unspecified hemorrhoids without mention of complication    Past Surgical History:  Procedure Laterality Date  . COLONOSCOPY  10/2003  . ESOPHAGOGASTRODUODENOSCOPY  10/2003   HH-had peptic bleeding, gastritis, neg H pylori  . MRI     Social History  Substance Use Topics  . Smoking status: Never Smoker  . Smokeless tobacco: Never Used  . Alcohol use No  Family History  Problem Relation Age of Onset  . Diabetes Father     borderline; elevated PSA  . Hypertension Mother   . Hypertension      GM  . Prostate cancer Paternal Grandfather   . Stroke Maternal Grandfather   . Parkinson's disease Maternal Aunt   . Healthy Sister   . Healthy Daughter     x 3   Allergies  Allergen Reactions  . Atorvastatin     REACTION: muscle aches/headache  . Crestor [Rosuvastatin]     Leg muscle pain   . Penicillins     REACTION: rash   Current Outpatient Prescriptions on File Prior to Visit  Medication Sig Dispense Refill  . Multiple Vitamin (MULTIVITAMIN) tablet Take 1 tablet by mouth daily.      . Omega-3 Fatty Acids (FISH OIL) 1200 MG CAPS Take 1 capsule by mouth 2  (two) times daily.     Marland Kitchen SALINE NASAL SPRAY NA Place into the nose as needed.     No current facility-administered medications on file prior to visit.      Review of Systems Review of Systems  Constitutional: Negative for fever, appetite change, fatigue and unexpected weight change.  Eyes: Negative for pain and visual disturbance.  Respiratory: Negative for cough and shortness of breath.   Cardiovascular: Negative for cp or palpitations    Gastrointestinal: Negative for nausea, diarrhea and constipation.  Genitourinary: Negative for urgency and frequency.  Skin: Negative for pallor or rash  pos for pos ingrown toe nail Neurological: Negative for weakness, light-headedness, numbness and headaches.  Hematological: Negative for adenopathy. Does not bruise/bleed easily.  Psychiatric/Behavioral: Negative for dysphoric mood. The patient is not nervous/anxious.         Objective:   Physical Exam  Constitutional: He is oriented to person, place, and time. He appears well-developed and well-nourished. No distress.  Well appearing   HENT:  Head: Normocephalic and atraumatic.  Right Ear: External ear normal.  Left Ear: External ear normal.  Nose: Nose normal.  Mouth/Throat: Oropharynx is clear and moist.  Eyes: Conjunctivae and EOM are normal. Pupils are equal, round, and reactive to light. Right eye exhibits no discharge. Left eye exhibits no discharge. No scleral icterus.  Neck: Normal range of motion. Neck supple. No JVD present. Carotid bruit is not present. No thyromegaly present.  Cardiovascular: Normal rate, regular rhythm, normal heart sounds and intact distal pulses.  Exam reveals no gallop.   Pulmonary/Chest: Effort normal and breath sounds normal. No respiratory distress. He has no wheezes. He has no rales.  Abdominal: Soft. Bowel sounds are normal. He exhibits no distension, no abdominal bruit and no mass. There is no tenderness.  Genitourinary: Rectal exam shows no mass, no  tenderness and anal tone normal. Prostate is not enlarged and not tender.  Genitourinary Comments: Nl prostate size and shape Symmetric and nt  Musculoskeletal: Normal range of motion. He exhibits no edema or tenderness.  Lymphadenopathy:    He has no cervical adenopathy.  Neurological: He is alert and oriented to person, place, and time. He has normal reflexes. No cranial nerve deficit. He exhibits normal muscle tone. Coordination normal.  Skin: Skin is warm and dry. No rash noted. No erythema. No pallor.  Solar lentigines diffusely Some SKs   Psychiatric: He has a normal mood and affect.          Assessment & Plan:   Problem List Items Addressed This Visit      Digestive  GERD    Sees GI- Dr Loreta AveMann Recently transitioned to H2 blocker and doing well       Relevant Medications   ranitidine (ZANTAC) 150 MG capsule     Other   Urge incontinence    Has seen urology  No longer on vesicare  Doing ok - will return prn Re assuring DRE today      Routine general medical examination at a health care facility - Primary    Reviewed health habits including diet and exercise and skin cancer prevention Reviewed appropriate screening tests for age  Also reviewed health mt list, fam hx and immunization status , as well as social and family history   See HPI Pt sees derm yearly  Will check on date of last colonoscopy Flu shot today  Labs today  Enc exercise and better diet  DRE is re assuring , psa with lab today      Relevant Orders   CBC with Differential/Platelet   Comprehensive metabolic panel   TSH   Lipid panel   Prostate cancer screening    Nl prostate on DRE-has also seen urology for frequent urination/ urge incontinence Overall doing well now  PGF with prostate cancer hx  Lab Results  Component Value Date   PSA 1.56 07/10/2013   PSA 1.28 07/23/2009   PSA 1.36 02/14/2007    psa drawn today      Hyperlipidemia    Disc goals for lipids and reasons to control  them Rev labs with pt (from last draw)  Will check lipid profile today  Rev low sat fat diet in detail -will try to do better and pack lunch so he does not eat fast food      Hyperglycemia    A1C today Diet has not been optimal  Also some wt gain Disc low glycemic diet and exercise to prevent DM2       Relevant Orders   Hemoglobin A1c   Colon cancer screening    Per pt we do not have his most recent colonoscopy report from Dr Loreta AveMann  Will send for that  He thinks he is up to date

## 2016-08-20 NOTE — Assessment & Plan Note (Signed)
Nl prostate on DRE-has also seen urology for frequent urination/ urge incontinence Overall doing well now  PGF with prostate cancer hx  Lab Results  Component Value Date   PSA 1.56 07/10/2013   PSA 1.28 07/23/2009   PSA 1.36 02/14/2007    psa drawn today

## 2016-08-20 NOTE — Progress Notes (Signed)
Pre visit review using our clinic review tool, if applicable. No additional management support is needed unless otherwise documented below in the visit note. 

## 2016-08-20 NOTE — Assessment & Plan Note (Signed)
A1C today Diet has not been optimal  Also some wt gain Disc low glycemic diet and exercise to prevent DM2

## 2016-08-20 NOTE — Patient Instructions (Addendum)
Work on getting in the habit of packing a lunch  If going to the gym is not convenient - then start using your elliptical machine  Aim for 30 minutes of added exercise 5 days per week if possible  Also make sure to drink enough water   For cholesterol   Avoid red meat/ fried foods/ egg yolks/ fatty breakfast meats/ butter, cheese and high fat dairy/ and shellfish   For blood sugar watch out for sweets and refined carbohydrates   Please send a request for records from GI at check out

## 2016-08-20 NOTE — Assessment & Plan Note (Signed)
Reviewed health habits including diet and exercise and skin cancer prevention Reviewed appropriate screening tests for age  Also reviewed health mt list, fam hx and immunization status , as well as social and family history   See HPI Pt sees derm yearly  Will check on date of last colonoscopy Flu shot today  Labs today  Enc exercise and better diet  DRE is re assuring , psa with lab today

## 2016-08-20 NOTE — Assessment & Plan Note (Signed)
Sees GI- Dr Loreta AveMann Recently transitioned to H2 blocker and doing well

## 2016-08-24 ENCOUNTER — Encounter: Payer: Self-pay | Admitting: *Deleted

## 2016-09-13 ENCOUNTER — Encounter: Payer: Self-pay | Admitting: Family Medicine

## 2016-12-18 ENCOUNTER — Telehealth: Payer: Self-pay | Admitting: Family Medicine

## 2016-12-18 DIAGNOSIS — E7800 Pure hypercholesterolemia, unspecified: Secondary | ICD-10-CM

## 2016-12-18 DIAGNOSIS — R739 Hyperglycemia, unspecified: Secondary | ICD-10-CM

## 2016-12-18 NOTE — Telephone Encounter (Signed)
-----   Message from Alvina Chouerri J Walsh sent at 12/17/2016  9:29 AM EDT ----- Regarding: Lab orders for Wednesday, 5.9.18 Lab orders, no f/u appt

## 2016-12-22 ENCOUNTER — Other Ambulatory Visit (INDEPENDENT_AMBULATORY_CARE_PROVIDER_SITE_OTHER): Payer: 59

## 2016-12-22 ENCOUNTER — Encounter (INDEPENDENT_AMBULATORY_CARE_PROVIDER_SITE_OTHER): Payer: Self-pay

## 2016-12-22 DIAGNOSIS — E7800 Pure hypercholesterolemia, unspecified: Secondary | ICD-10-CM

## 2016-12-22 DIAGNOSIS — R739 Hyperglycemia, unspecified: Secondary | ICD-10-CM

## 2016-12-22 LAB — LIPID PANEL
Cholesterol: 187 mg/dL (ref 0–200)
HDL: 48.6 mg/dL (ref >39.00)
LDL Cholesterol: 119 mg/dL — ABNORMAL HIGH (ref 0–99)
NonHDL: 138.82
TRIGLYCERIDES: 99 mg/dL (ref 0.0–149.0)
Total CHOL/HDL Ratio: 4
VLDL: 19.8 mg/dL (ref 0.0–40.0)

## 2016-12-22 LAB — HEMOGLOBIN A1C: HEMOGLOBIN A1C: 5.9 % (ref 4.6–6.5)

## 2016-12-23 ENCOUNTER — Encounter: Payer: Self-pay | Admitting: *Deleted

## 2017-12-01 ENCOUNTER — Encounter: Payer: Self-pay | Admitting: Primary Care

## 2017-12-01 ENCOUNTER — Ambulatory Visit (INDEPENDENT_AMBULATORY_CARE_PROVIDER_SITE_OTHER): Payer: 59 | Admitting: Primary Care

## 2017-12-01 ENCOUNTER — Ambulatory Visit (INDEPENDENT_AMBULATORY_CARE_PROVIDER_SITE_OTHER)
Admission: RE | Admit: 2017-12-01 | Discharge: 2017-12-01 | Disposition: A | Discharge status: Home / Self Care | Payer: 59 | Source: Ambulatory Visit | Attending: Primary Care | Admitting: Primary Care

## 2017-12-01 VITALS — BP 160/82 | HR 78 | Temp 98.0°F | Ht 68.5 in | Wt 180.8 lb

## 2017-12-01 DIAGNOSIS — M25571 Pain in right ankle and joints of right foot: Secondary | ICD-10-CM | POA: Diagnosis not present

## 2017-12-01 DIAGNOSIS — M79604 Pain in right leg: Secondary | ICD-10-CM | POA: Diagnosis not present

## 2017-12-01 NOTE — Patient Instructions (Signed)
Complete xray(s) prior to leaving today. I will notify you of your results once received.  Wrap the ankle with ankle with an ace bandage for support. Elevate and ice the ankle when resting.  Start Ibuprofen 600 mg three times daily for pain and inflammation.  Please call me if you develop redness, increased swelling, drainage around the wound site.  Keep the wound cleansed and dressed as discussed.   It was a pleasure meeting you!

## 2017-12-01 NOTE — Progress Notes (Signed)
Subjective:    Patient ID: Richard Yates, male    DOB: [birth year], 62 y.o.   MRN: [id number]  HPI  Richard Yates is a 62 year old male who presents today with a chief complaint of lower extremity pain. He has an appointment with orthopedics at 3 pm, but was planning on cancelling if not needed after Primary Care visit.   His pain is located to the right ankle (posterior and lateral) and anterior mid tibial region. He was leaf blowing (back pack blower) his dads back steps last night at 7 pm, started walking down the stairs, tripped, and fell down 6 stairs. The leaf blower was not on his body when he landed. His right shin dug onto the stairs which was bleeding initially.   Since then he's noticed ankle pain and swelling (lateral and posterior ankle). He irrigated and dressed his wound to the anterior tibia immediately. He's been using crutches as he cannot bear much weight without significant pain. His last tetanus vaccination was in 2016.   He denies numbness/tingling, color changes of right ankle/foot.   Review of Systems  Constitutional: Negative for fever.  Musculoskeletal: Positive for joint swelling.       Right ankle and anterior tibial pain  Skin: Positive for wound. Negative for color change.  Neurological: Negative for numbness.       Past Medical History:  Diagnosis Date  . Allergic rhinitis, cause unspecified   . DDD (degenerative disc disease) 10/2000  . Esophageal reflux   . Pure hypercholesterolemia   . Rash and other nonspecific skin eruption   . Routine general medical examination at a health care facility   . Seborrheic dermatitis, unspecified   . Special screening for malignant neoplasm of prostate   . Unspecified gastritis and gastroduodenitis without mention of hemorrhage   . Unspecified hemorrhoids without mention of complication      Social History   Socioeconomic History  . Marital status: Married    Spouse name: Not on file  . Number of children:  Not on file  . Years of education: Not on file  . Highest education level: Not on file  Occupational History  . Occupation: [employer]  Social Needs  . Financial resource strain: Not on file  . Food insecurity:    Worry: Not on file    Inability: Not on file  . Transportation needs:    Medical: Not on file    Non-medical: Not on file  Tobacco Use  . Smoking status: Never Smoker  . Smokeless tobacco: Never Used  Substance and Sexual Activity  . Alcohol use: No    Alcohol/week: 0.0 oz  . Drug use: No  . Sexual activity: Not on file  Lifestyle  . Physical activity:    Days per week: Not on file    Minutes per session: Not on file  . Stress: Not on file  Relationships  . Social connections:    Talks on phone: Not on file    Gets together: Not on file    Attends religious service: Not on file    Active member of club or organization: Not on file    Attends meetings of clubs or organizations: Not on file    Relationship status: Not on file  . Intimate partner violence:    Fear of current or ex partner: Not on file    Emotionally abused: Not on file    Physically abused: Not on file    Forced sexual activity:  Not on file  Other Topics Concern  . Not on file  Social History Narrative   Lives with wife in a one story home.  Has 3 grown daughter.     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     Education: high school.     Family History  Problem Relation Age of Onset  . Diabetes Father        borderline; elevated PSA  . Hypertension Mother   . Hypertension Unknown        GM  . Prostate cancer Paternal Grandfather   . Stroke Maternal Grandfather   . Parkinson's disease Maternal Aunt   . Healthy Sister   . Healthy Daughter        x 3    Allergies  Allergen Reactions  . Atorvastatin     REACTION: muscle aches/headache  . Crestor [Rosuvastatin]     Leg muscle pain   . Penicillins     REACTION: rash    Current Outpatient Medications on File Prior to Visit  Medication  Sig Dispense Refill  . B Complex Vitamins (VITAMIN B COMPLEX PO) Take 1 tablet by mouth daily.    . Cholecalciferol (VITAMIN D PO) Take 1 tablet by mouth daily.    . Multiple Vitamin (MULTIVITAMIN) tablet Take 1 tablet by mouth daily.      . Omega-3 Fatty Acids (FISH OIL) 1200 MG CAPS Take 1 capsule by mouth 2 (two) times daily.     . ranitidine (ZANTAC) 150 MG capsule Take 150 mg by mouth 2 (two) times daily.    Marland Kitchen SALINE NASAL SPRAY NA Place into the nose as needed.    Marland Kitchen VITAMIN E PO Take 1 tablet by mouth daily.     No current facility-administered medications on file prior to visit.     BP (!) 160/82   Pulse 78   Temp 98 F (36.7 C) (Oral)   Ht 5' 8.5" (1.74 m)   Wt 180 lb 12 oz (82 kg)   SpO2 97%   BMI 27.08 kg/m    Objective:   Physical Exam  Constitutional: He appears well-nourished.  Neck: Neck supple.  Cardiovascular: Normal rate and regular rhythm.  Pulmonary/Chest: Effort normal and breath sounds normal.  Musculoskeletal:       Right ankle: He exhibits decreased range of motion and swelling. He exhibits no ecchymosis, no deformity, no laceration and normal pulse. Tenderness. Proximal fibula tenderness found.  Right lateral malleolus and posterior ankle edema. Pedal pulses 2+. No erythema or bruising. Overall good ROM, but pain with dorsal flexion.   Skin:     Several abrasions and puncture wounds to right anterior mid tibial region. No surrounding erythema, active bleeding, lacerations, debris.          Assessment & Plan:  Fibular Fracture:  Right ankle and mid tibial pain s/p fall the night prior. Xray of ankle with evidence of mildly displaced fibular fracture. Xray of tibia unremarkable. Wound irrigated and dressed in office. Patient fitted for orthotic boot and advised to follow up with orthopedics as scheduled. Continue non weight bearing activity with crutches.   Doreene Nest, NP

## 2017-12-02 ENCOUNTER — Telehealth: Payer: Self-pay | Admitting: Family Medicine

## 2017-12-02 NOTE — Telephone Encounter (Signed)
Copied from CRM [id number]. Topic: Quick Communication - See Telephone Encounter >> Dec 02, 2017  8:10 AM Clack, Princella PellegriniJessica D wrote: CRM for notification. See Telephone encounter for: 12/02/17.  Pt wanted to know if he should be on an antibiotic for his ankle injury.  [id number] [address] [address], [place] [facility] [address] [phone] (Phone) [phone] (Fax)

## 2017-12-05 NOTE — Telephone Encounter (Signed)
I spoke with pt; pt was seen 12/01/17; no redness or swelling at site of wound; no fever. Pt changing a non stick bandage daily and the drainage is less each time. Pt request cb after Allayne GitelmanK Clark NP reviews.[facility].

## 2017-12-05 NOTE — Telephone Encounter (Signed)
Wound did not seem infectious at time of visit, based off of triage note he doesn't seem to be exhibiting symptoms of infection. No oral antibiotics needed. Just clean dressing changes as discussed.

## 2017-12-06 ENCOUNTER — Telehealth: Payer: Self-pay | Admitting: Family Medicine

## 2017-12-06 NOTE — Telephone Encounter (Addendum)
Message left for patient to return my call.  Triage nurse please disclose Richard Yates's comments for patient.

## 2017-12-06 NOTE — Telephone Encounter (Signed)
Pt called in because he had called earlier questioning whether he needed to be on an antibiotic for his wound. I read him Graylon GunningKate Clark's note that he did not need an antibiotic at this time.  I instructed him in the s/s to watch for as signs of infection:   Redness, swelling, drainage, warmth, fever.   Needing to change the drsg more often due to drainage.  If any of these occur to call us back.    He verbalized understanding.

## 2017-12-07 NOTE — Telephone Encounter (Signed)
Spoken and notified patient of Kate Clark's comments. Patient verbalized understanding.  

## 2020-01-28 ENCOUNTER — Other Ambulatory Visit (INDEPENDENT_AMBULATORY_CARE_PROVIDER_SITE_OTHER): Payer: 59

## 2020-01-28 ENCOUNTER — Telehealth: Payer: Self-pay | Admitting: Family Medicine

## 2020-01-28 DIAGNOSIS — Z0000 Encounter for general adult medical examination without abnormal findings: Secondary | ICD-10-CM

## 2020-01-28 DIAGNOSIS — R7303 Prediabetes: Secondary | ICD-10-CM | POA: Diagnosis not present

## 2020-01-28 DIAGNOSIS — Z125 Encounter for screening for malignant neoplasm of prostate: Secondary | ICD-10-CM

## 2020-01-28 LAB — COMPREHENSIVE METABOLIC PANEL
ALT: 17 U/L (ref 0–53)
AST: 21 U/L (ref 0–37)
Albumin: 4.4 g/dL (ref 3.5–5.2)
Alkaline Phosphatase: 78 U/L (ref 39–117)
BUN: 18 mg/dL (ref 6–23)
CO2: 27 mEq/L (ref 19–32)
Calcium: 9.7 mg/dL (ref 8.4–10.5)
Chloride: 102 mEq/L (ref 96–112)
Creatinine, Ser: 1.06 mg/dL (ref 0.40–1.50)
GFR: 70.56 mL/min (ref >60.00)
Glucose, Bld: 103 mg/dL — ABNORMAL HIGH (ref 70–99)
Potassium: 5.7 mEq/L — ABNORMAL HIGH (ref 3.5–5.1)
Sodium: 136 mEq/L (ref 135–145)
Total Bilirubin: 0.8 mg/dL (ref 0.2–1.2)
Total Protein: 6.6 g/dL (ref 6.0–8.3)

## 2020-01-28 LAB — LIPID PANEL
Cholesterol: 146 mg/dL (ref 0–200)
HDL: 42 mg/dL (ref >39.00)
LDL Cholesterol: 85 mg/dL (ref 0–99)
NonHDL: 103.75
Total CHOL/HDL Ratio: 3
Triglycerides: 92 mg/dL (ref 0.0–149.0)
VLDL: 18.4 mg/dL (ref 0.0–40.0)

## 2020-01-28 LAB — CBC WITH DIFFERENTIAL/PLATELET
Basophils Absolute: 0 10*3/uL (ref 0.0–0.1)
Basophils Relative: 0.5 % (ref 0.0–3.0)
Eosinophils Absolute: 0.3 10*3/uL (ref 0.0–0.7)
Eosinophils Relative: 3.2 % (ref 0.0–5.0)
HCT: 42.1 % (ref 39.0–52.0)
Hemoglobin: 14.4 g/dL (ref 13.0–17.0)
Lymphocytes Relative: 17.3 % (ref 12.0–46.0)
Lymphs Abs: 1.5 10*3/uL (ref 0.7–4.0)
MCHC: 34.1 g/dL (ref 30.0–36.0)
MCV: 89.4 fl (ref 78.0–100.0)
Monocytes Absolute: 0.4 10*3/uL (ref 0.1–1.0)
Monocytes Relative: 4.2 % (ref 3.0–12.0)
Neutro Abs: 6.6 10*3/uL (ref 1.4–7.7)
Neutrophils Relative %: 74.8 % (ref 43.0–77.0)
Platelets: 294 10*3/uL (ref 150.0–400.0)
RBC: 4.71 Mil/uL (ref 4.22–5.81)
RDW: 13 % (ref 11.5–15.5)
WBC: 8.9 10*3/uL (ref 4.0–10.5)

## 2020-01-28 LAB — HEMOGLOBIN A1C: Hgb A1c MFr Bld: 6 % (ref 4.6–6.5)

## 2020-01-28 LAB — TSH: TSH: 2.64 u[IU]/mL (ref 0.35–4.50)

## 2020-01-28 LAB — PSA: PSA: 1.14 ng/mL (ref 0.10–4.00)

## 2020-01-28 NOTE — Telephone Encounter (Signed)
Lab orders

## 2020-01-29 ENCOUNTER — Telehealth: Payer: Self-pay | Admitting: *Deleted

## 2020-01-29 NOTE — Telephone Encounter (Signed)
Left VM requesting pt to call the office back regarding lab results  

## 2020-01-31 NOTE — Telephone Encounter (Signed)
Addressed through result notes  

## 2020-02-01 ENCOUNTER — Encounter: Payer: Self-pay | Admitting: Family Medicine

## 2020-02-01 ENCOUNTER — Other Ambulatory Visit: Payer: Self-pay

## 2020-02-01 ENCOUNTER — Encounter: Payer: 59 | Admitting: Family Medicine

## 2020-02-01 ENCOUNTER — Ambulatory Visit (INDEPENDENT_AMBULATORY_CARE_PROVIDER_SITE_OTHER): Payer: 59 | Admitting: Family Medicine

## 2020-02-01 VITALS — BP 130/78 | HR 61 | Temp 96.8°F | Ht 68.75 in | Wt 179.2 lb

## 2020-02-01 DIAGNOSIS — E875 Hyperkalemia: Secondary | ICD-10-CM | POA: Diagnosis not present

## 2020-02-01 DIAGNOSIS — Z0000 Encounter for general adult medical examination without abnormal findings: Secondary | ICD-10-CM | POA: Diagnosis not present

## 2020-02-01 DIAGNOSIS — R7303 Prediabetes: Secondary | ICD-10-CM

## 2020-02-01 DIAGNOSIS — Z125 Encounter for screening for malignant neoplasm of prostate: Secondary | ICD-10-CM | POA: Diagnosis not present

## 2020-02-01 DIAGNOSIS — E7800 Pure hypercholesterolemia, unspecified: Secondary | ICD-10-CM | POA: Diagnosis not present

## 2020-02-01 LAB — BASIC METABOLIC PANEL
BUN: 16 mg/dL (ref 6–23)
CO2: 30 mEq/L (ref 19–32)
Calcium: 10 mg/dL (ref 8.4–10.5)
Chloride: 102 mEq/L (ref 96–112)
Creatinine, Ser: 1.08 mg/dL (ref 0.40–1.50)
GFR: 69.05 mL/min (ref >60.00)
Glucose, Bld: 94 mg/dL (ref 70–99)
Potassium: 4.6 mEq/L (ref 3.5–5.1)
Sodium: 134 mEq/L — ABNORMAL LOW (ref 135–145)

## 2020-02-01 NOTE — Patient Instructions (Addendum)
To prevent diabetes  Try to get most of your carbohydrates from produce (with the exception of white potatoes)  Eat less bread/pasta/rice/snack foods/cereals/sweets and other items from the middle of the grocery store (processed carbs)   Your palpitations may correlate with caffeine intake   Gradually cut caffeine  Cut to 1 1/2 cups of coffee for a week  Then 1  Then 1/2  Then none  You can transition to decaf or must water  If palpitations do not improve- please come back for a visit   Continue the rosuvastatin for cholesterol unless side effects

## 2020-02-01 NOTE — Progress Notes (Signed)
Subjective:    Patient ID: Richard Yates, male    DOB: May 02, 1956, 64 y.o.   MRN: [id number]  This visit occurred during the SARS-CoV-2 public health emergency.  Safety protocols were in place, including screening questions prior to the visit, additional usage of staff PPE, and extensive cleaning of exam room while observing appropriate contact time as indicated for disinfecting solutions.    HPI Here for health maintenance exam and to review chronic medical problems    Wt Readings from Last 3 Encounters:  02/01/20 179 lb 3 oz (81.3 kg)  12/01/17 180 lb 12 oz (82 kg)  08/20/16 179 lb (81.2 kg)  stable  Has been overeating  Work is physical     (no added exercise)  Does push mow yard  26.65 kg/m  covid status - vaccinated Radiographer, therapeutic  Flu vaccine -fall  Tdap 6/16  Colonoscopy 10/15  With 10 y recall   Prostate health  Lab Results  Component Value Date   PSA 1.14 01/28/2020   PSA 1.94 08/20/2016   PSA 1.56 07/10/2013  PGF with prostate cancer  His urinary symptoms are improved  Sees urology  Nocturia 0-1     BP Readings from Last 3 Encounters:  02/01/20 130/78  12/01/17 (!) 160/82  08/20/16 116/78   Pulse Readings from Last 3 Encounters:  02/01/20 61  12/01/17 78  08/20/16 78   Prediabetes Lab Results  Component Value Date   HGBA1C 6.0 01/28/2020  stable    Hyperkalemia with labs ? Error Lab Results  Component Value Date   CREATININE 1.06 01/28/2020   BUN 18 01/28/2020   NA 136 01/28/2020   K 5.7 No hemolysis seen (H) 01/28/2020   CL 102 01/28/2020   CO2 27 01/28/2020  mvi and supplements     Lab Results  Component Value Date   ALT 17 01/28/2020   AST 21 01/28/2020   ALKPHOS 78 01/28/2020   BILITOT 0.8 01/28/2020    Hyperlipidemia Lab Results  Component Value Date   CHOL 146 01/28/2020   CHOL 187 12/22/2016   CHOL 258 (H) 08/20/2016   Lab Results  Component Value Date   HDL 42.00 01/28/2020   HDL 48.60 12/22/2016   HDL 45.40  08/20/2016   Lab Results  Component Value Date   LDLCALC 85 01/28/2020   LDLCALC 119 (H) 12/22/2016   LDLCALC 183 (H) 08/20/2016   Lab Results  Component Value Date   TRIG 92.0 01/28/2020   TRIG 99.0 12/22/2016   TRIG 147.0 08/20/2016   Lab Results  Component Value Date   CHOLHDL 3 01/28/2020   CHOLHDL 4 12/22/2016   CHOLHDL 6 08/20/2016   Lab Results  Component Value Date   LDLDIRECT 162.2 12/13/2012   LDLDIRECT 150.7 09/08/2012   LDLDIRECT 167.9 05/30/2012  taking rosuvastatin - son in law px it (is a physician)  HDL still a little lower than desired   Intol of atorvastatin   occ palpitations  Drinking more coffee -2 big servings every am  Not much soda Some tea   Patient Active Problem List   Diagnosis Date Noted  . Hyperkalemia 02/01/2020  . Urge incontinence 08/20/2016  . Neck pain, chronic 05/16/2015  . Numbness and tingling in hands 05/16/2015  . Prediabetes 04/17/2014  . Colon cancer screening 11/07/2013  . Frequent urination 07/10/2013  . Prostate cancer screening 07/10/2013  . Routine general medical examination at a health care facility 05/29/2012  . ALLERGIC RHINITIS 11/13/2008  . DERMATITIS, SEBORRHEIC  08/29/2008  . HEMORRHOIDS 06/07/2008  . Hyperlipidemia 02/02/2007  . GERD 02/02/2007   Past Medical History:  Diagnosis Date  . Allergic rhinitis, cause unspecified   . DDD (degenerative disc disease) 10/2000  . Esophageal reflux   . Pure hypercholesterolemia   . Rash and other nonspecific skin eruption   . Routine general medical examination at a health care facility   . Seborrheic dermatitis, unspecified   . Special screening for malignant neoplasm of prostate   . Unspecified gastritis and gastroduodenitis without mention of hemorrhage   . Unspecified hemorrhoids without mention of complication    Past Surgical History:  Procedure Laterality Date  . COLONOSCOPY  10/2003  . ESOPHAGOGASTRODUODENOSCOPY  10/2003   HH-had peptic bleeding,  gastritis, neg H pylori  . MRI     Social History   Tobacco Use  . Smoking status: Never Smoker  . Smokeless tobacco: Never Used  Substance Use Topics  . Alcohol use: No    Alcohol/week: 0.0 standard drinks  . Drug use: No   Family History  Problem Relation Age of Onset  . Diabetes Father        borderline; elevated PSA  . Hypertension Mother   . Hypertension Unknown        GM  . Prostate cancer Paternal Grandfather   . Stroke Maternal Grandfather   . Parkinson's disease Maternal Aunt   . Healthy Sister   . Healthy Daughter        x 3   Allergies  Allergen Reactions  . Penicillins Hives    REACTION: rash  . Atorvastatin     REACTION: muscle aches/headache   Current Outpatient Medications on File Prior to Visit  Medication Sig Dispense Refill  . ASPIRIN 81 PO Take 81 mg by mouth 1 day or 1 dose.     Marland Kitchen Cetirizine HCl (ZYRTEC ALLERGY) 10 MG CAPS 1 day or 1 dose.    Marland Kitchen co-enzyme Q-10 30 MG capsule Take 300 mg by mouth daily.    . Multiple Vitamin (MULTIVITAMIN) tablet Take 1 tablet by mouth daily.      . Omega-3 Fatty Acids (FISH OIL) 1200 MG CAPS Take 1 capsule by mouth 2 (two) times daily.     Marland Kitchen omeprazole (PRILOSEC) 20 MG capsule Take 20 mg by mouth daily.    . rosuvastatin (CRESTOR) 10 MG tablet Take 10 mg by mouth daily.    Marland Kitchen SALINE NASAL SPRAY NA Place into the nose as needed.    . TURMERIC PO Take 1,500 mg by mouth 1 day or 1 dose.    Marland Kitchen VITAMIN E PO Take 1 tablet by mouth daily.    . B Complex Vitamins (VITAMIN B COMPLEX PO) Take 1 tablet by mouth daily. (Patient not taking: Reported on 02/01/2020)    . Cholecalciferol (VITAMIN D PO) Take 1 tablet by mouth daily. (Patient not taking: Reported on 02/01/2020)     No current facility-administered medications on file prior to visit.    Review of Systems  Constitutional: Negative for activity change, appetite change, fatigue, fever and unexpected weight change.  HENT: Negative for congestion, rhinorrhea, sore throat  and trouble swallowing.   Eyes: Negative for pain, redness, itching and visual disturbance.  Respiratory: Negative for cough, chest tightness, shortness of breath and wheezing.   Cardiovascular: Positive for palpitations. Negative for chest pain.  Gastrointestinal: Negative for abdominal pain, blood in stool, constipation, diarrhea and nausea.  Endocrine: Negative for cold intolerance, heat intolerance, polydipsia and polyuria.  Genitourinary: Negative for difficulty urinating, dysuria, frequency and urgency.  Musculoskeletal: Negative for arthralgias, joint swelling and myalgias.  Skin: Negative for pallor and rash.  Neurological: Negative for dizziness, tremors, weakness, numbness and headaches.  Hematological: Negative for adenopathy. Does not bruise/bleed easily.  Psychiatric/Behavioral: Negative for decreased concentration and dysphoric mood. The patient is not nervous/anxious.        Objective:   Physical Exam Constitutional:      General: He is not in acute distress.    Appearance: Normal appearance. He is well-developed and normal weight. He is not ill-appearing or diaphoretic.  HENT:     Head: Normocephalic and atraumatic.     Right Ear: Tympanic membrane, ear canal and external ear normal.     Left Ear: Tympanic membrane, ear canal and external ear normal.     Nose: Nose normal. No congestion.     Mouth/Throat:     Mouth: Mucous membranes are moist.     Pharynx: Oropharynx is clear. No posterior oropharyngeal erythema.  Eyes:     General: No scleral icterus.       Right eye: No discharge.        Left eye: No discharge.     Conjunctiva/sclera: Conjunctivae normal.     Pupils: Pupils are equal, round, and reactive to light.  Neck:     Thyroid: No thyromegaly.     Vascular: No carotid bruit or JVD.  Cardiovascular:     Rate and Rhythm: Normal rate and regular rhythm.     Pulses: Normal pulses.     Heart sounds: Normal heart sounds. No gallop.   Pulmonary:     Effort:  Pulmonary effort is normal. No respiratory distress.     Breath sounds: Normal breath sounds. No wheezing or rales.     Comments: Good air exch Chest:     Chest wall: No tenderness.  Abdominal:     General: Bowel sounds are normal. There is no distension or abdominal bruit.     Palpations: Abdomen is soft. There is no mass.     Tenderness: There is no abdominal tenderness.     Hernia: No hernia is present.  Musculoskeletal:        General: No tenderness.     Cervical back: Normal range of motion and neck supple. No rigidity. No muscular tenderness.     Right lower leg: No edema.     Left lower leg: No edema.  Lymphadenopathy:     Cervical: No cervical adenopathy.  Skin:    General: Skin is warm and dry.     Coloration: Skin is not pale.     Findings: No erythema or rash.     Comments: Tanned Some lentigines   Neurological:     Mental Status: He is alert.     Cranial Nerves: No cranial nerve deficit.     Motor: No abnormal muscle tone.     Coordination: Coordination normal.     Gait: Gait normal.     Deep Tendon Reflexes: Reflexes are normal and symmetric. Reflexes normal.  Psychiatric:        Mood and Affect: Mood normal.        Cognition and Memory: Cognition and memory normal.           Assessment & Plan:   Problem List Items Addressed This Visit      Other   Hyperlipidemia    Disc goals for lipids and reasons to control them Rev last labs with pt  Rev low sat fat diet in detail  Improved with low dose crestor  Enc him to continue        Relevant Medications   ASPIRIN 81 PO   rosuvastatin (CRESTOR) 10 MG tablet   Routine general medical examination at a health care facility - Primary    Reviewed health habits including diet and exercise and skin cancer prevention Reviewed appropriate screening tests for age  Also reviewed health mt list, fam hx and immunization status , as well as social and family history   See HPI Labs reviewed  Wt is stable -enc  more exercise  covid vaccinated Enc consideration of shingrix  psa is stable to improved (PGF has prostate cancer) Cholesterol is improved on crestor  Enc to wean off caffeine in light of palpitations         Prostate cancer screening    Lab Results  Component Value Date   PSA 1.14 01/28/2020   PSA 1.94 08/20/2016   PSA 1.56 07/10/2013    GF had prostate cancer  No significant voiding changes      Prediabetes    Lab Results  Component Value Date   HGBA1C 6.0 01/28/2020   disc imp of low glycemic diet and wt loss to prevent DM2       Hyperkalemia    K of 5.7 w/o reason  Suspect hemolysis  Re check today      Relevant Orders   Basic metabolic panel (Completed)

## 2020-02-02 NOTE — Assessment & Plan Note (Signed)
Reviewed health habits including diet and exercise and skin cancer prevention Reviewed appropriate screening tests for age  Also reviewed health mt list, fam hx and immunization status , as well as social and family history   See HPI Labs reviewed  Wt is stable -enc more exercise  covid vaccinated Enc consideration of shingrix  psa is stable to improved (PGF has prostate cancer) Cholesterol is improved on crestor  Enc to wean off caffeine in light of palpitations

## 2020-02-02 NOTE — Assessment & Plan Note (Signed)
Lab Results  Component Value Date   PSA 1.14 01/28/2020   PSA 1.94 08/20/2016   PSA 1.56 07/10/2013    GF had prostate cancer  No significant voiding changes

## 2020-02-02 NOTE — Assessment & Plan Note (Signed)
Lab Results  Component Value Date   HGBA1C 6.0 01/28/2020   disc imp of low glycemic diet and wt loss to prevent DM2

## 2020-02-02 NOTE — Assessment & Plan Note (Signed)
K of 5.7 w/o reason  Suspect hemolysis  Re check today

## 2020-02-02 NOTE — Assessment & Plan Note (Signed)
Disc goals for lipids and reasons to control them Rev last labs with pt Rev low sat fat diet in detail  Improved with low dose crestor  Enc him to continue

## 2020-04-24 ENCOUNTER — Telehealth: Payer: Self-pay

## 2020-04-24 NOTE — Telephone Encounter (Signed)
I was unable to reach pt or pts wife at any contact #. I did leave v/m for pt to cb to [facility] on home phone per DPR. FYI to [place] CMA and Dr Milinda Antis.

## 2020-04-24 NOTE — Telephone Encounter (Signed)
Tried calling pt again no answer

## 2020-04-24 NOTE — Telephone Encounter (Signed)
[facility] Day - Client TELEPHONE ADVICE RECORD AccessNurse Patient Name: Richard Yates Gender: Male DOB: 06-26-1956 Age: 64 Y 2 M 26 D Return Phone Number: [phone] (Primary), [phone] (Secondary) Address: [place] [address] Client [facility] Day - Client Client Site [facility] - Day Physician Tower, Idamae Schuller - MD Contact Type Call Who Is Calling Patient / Member / Family / Caregiver Call Type Triage / Clinical Relationship To Patient Self Return Phone Number [phone] (Primary) Chief Complaint ABDOMINAL PAIN - Severe and only in abdomen Reason for Call Symptomatic / Request for Health Information Initial Comment Caller states he has lower abdominal pain (sometimes severe), hurts between belly button and groin, worse when urinating, and cramping since Tuesday with fever. Translation No Nurse Assessment Nurse: Kizzie Bane, RN, Marylene Land Date/Time (Eastern Time): 04/24/2020 8:18:30 AM Confirm and document reason for call. If symptomatic, describe symptoms. ---Caller states he is having lower abdominal pain that started on Tuesday. No fever. Has the patient had close contact with a person known or suspected to have the novel coronavirus illness OR traveled / lives in area with major community spread (including international travel) in the last 14 days from the onset of symptoms? * If Asymptomatic, screen for exposure and travel within the last 14 days. ---No Does the patient have any new or worsening symptoms? ---Yes Will a triage be completed? ---Yes Related visit to physician within the last 2 weeks? ---No Does the PT have any chronic conditions? (i.e. diabetes, asthma, this includes High risk factors for pregnancy, etc.) ---No Is this a behavioral health or substance abuse call? ---No Guidelines Guideline Title Affirmed Question Affirmed Notes Nurse Date/Time  (Eastern Time) Abdominal Pain - Male [1] MODERATE pain (e.g., interferes with normal activities) AND [2] pain comes and goes (cramps) AND [3] present > 24 hours (Exception: pain with Vomiting Kizzie Bane, RN, Marylene Land 04/24/2020 8:20:30 AM PLEASE NOTE: All timestamps contained within this report are represented as [place] Standard Time. CONFIDENTIALTY NOTICE: This fax transmission is intended only for the addressee. It contains information that is legally privileged, confidential or otherwise protected from use or disclosure. If you are not the intended recipient, you are strictly prohibited from reviewing, disclosing, copying using or disseminating any of this information or taking any action in reliance on or regarding this information. If you have received this fax in error, please notify us immediately by telephone so that we can arrange for its return to [place]. Phone: [phone], Toll-Free: [phone], Fax: [phone] Page: 2 of 2 Call Id: [id number] Guidelines Guideline Title Affirmed Question Affirmed Notes Nurse Date/Time [name] Time) or Diarrhea - see that Guideline) Disp. Time [name] Time) Disposition Final User 04/24/2020 8:17:12 AM Send to Urgent Juel Burrow 04/24/2020 8:25:41 AM See PCP within 24 Hours Yes Kizzie Bane, RN, Rosalyn Charters Disagree/Comply Comply Caller Understands Yes PreDisposition Did not know what to do Care Advice Given Per Guideline SEE PCP WITHIN 24 HOURS: * IF OFFICE WILL BE OPEN: You need to be examined within the next 24 hours. Call your doctor (or NP/PA) when the office opens and make an appointment. CARE ADVICE given per Abdominal Pain, Male (Adult) guideline. CALL BACK IF: * Severe pain lasts over 1 hour * Constant pain lasts over 2 hours * You become worse OTC MEDS - BISMUTH SUBSALICYLATE (E.G., KAOPECTATE, PEPTO-BISMOL): * Helps reduce abdominal cramping, diarrhea, and vomiting. * Do not use for more than 2 days. Referrals GO TO FACILITY  UNDECIDED

## 2020-04-24 NOTE — Telephone Encounter (Signed)
Left VM requesting pt to call the office also

## 2020-04-24 NOTE — Telephone Encounter (Signed)
Please continue to try and reach (? If he went to UC already)  Needs first avail or UC

## 2020-04-25 NOTE — Telephone Encounter (Signed)
Between me and Artelia Laroche we have tried at least 3 times to reach Richard Yates. Will await a call back from Richard Yates

## 2020-08-12 ENCOUNTER — Telehealth: Payer: Self-pay

## 2020-08-12 NOTE — Telephone Encounter (Signed)
I left a detailed message on patient's voice mail if he'd like to schedule a virtual visit, to call back.

## 2020-08-12 NOTE — Telephone Encounter (Signed)
Please reach out to check in  Schedule virtual appt if desired /needed

## 2020-08-12 NOTE — Telephone Encounter (Signed)
[facility] Day - Client TELEPHONE ADVICE RECORD Access[facility] Patient Name: Richard Yates Gender: Male DOB: 1955-10-10 Age: 64 Y 6 M 13 D Return Phone Number: [phone] (Primary), [phone] (Secondary) Address: [place] [address] Client [facility] Day - Client Client Site  [facility] [place] - Day Physician Milinda Antis, Idamae Schuller - MD Contact Type Call Who Is Calling Patient / Member / Family / Caregiver Call Type Triage / Clinical Relationship To Patient Self Return Phone Number [phone] (Primary) Chief Complaint Headache Reason for Call Symptomatic / Request for Health Information Initial Comment 1 of 2 Caller stated he tested positive for Covid Christmas morning, runny nose, chills, headache. Wife is also positive. Translation No Nurse Assessment Nurse: Elesa Hacker, RN, Nash Dimmer Date/Time [name] Time): 08/11/2020 6:41:39 PM Confirm and document reason for call. If symptomatic, describe symptoms. ---Tested Positive on Christmas day. Advised that he was fully vaccinated with [employer]. No temp. Muscle aches. Does the patient have any new or worsening symptoms? ---Yes Will a triage be completed? ---Yes Related visit to physician within the last 2 weeks? ---No Does the PT have any chronic conditions? (i.e. diabetes, asthma, this includes High risk factors for pregnancy, etc.) ---Yes List chronic conditions. ---high cholesterol Is this a behavioral health or substance abuse call? ---No Guidelines Guideline Title Affirmed Question Affirmed Notes Nurse Date/Time (Eastern Time) COVID-19 - Diagnosed or Suspected [1] COVID-19 diagnosed by doctor (or NP/PA) AND [2] mild symptoms (e.g., cough, fever, others) AND [3] no complications or SOB Deaton, RN, Nash Dimmer 08/11/2020 6:43:03 PM Disp. Time [name] Time) Disposition Final User 08/11/2020 6:49:37 PM Home Care Yes Deaton, RN, Nash Dimmer PLEASE NOTE: All  timestamps contained within this report are represented as [place] Standard Time. CONFIDENTIALTY NOTICE: This fax transmission is intended only for the addressee. It contains information that is legally privileged, confidential or otherwise protected from use or disclosure. If you are not the intended recipient, you are strictly prohibited from reviewing, disclosing, copying using or disseminating any of this information or taking any action in reliance on or regarding this information. If you have received this fax in error, please notify us immediately by telephone so that we can arrange for its return to [place]. Phone: [phone], Toll-Free: [phone], Fax: [phone] Page: 2 of 2 Call Id: [id number] Caller Disagree/Comply Comply Caller Understands Yes PreDisposition Did not know what to do Care Advice Given Per Guideline HOME CARE: * You should be able to treat this at home. REASSURANCE AND EDUCATION - DIAGNOSED WITH COVID-19 BY DOCTOR (OR NP/PA) AND MILD SYMPTOMS: * Your doctor has diagnosed you as having COVID-19 based on your symptoms and COVID-19 testing. * If you have not been tested yet for COVID-19, we recommend that you get tested (probably nose swab) in the next 3 days. * For some people, the symptoms of COVID-19 can be mild, especially if you are healthy and under 81 years old. GENERAL CARE ADVICE FOR COVID-19 SYMPTOMS: * The treatment is the same whether you have COVID-19, influenza or some other respiratory virus. * Cough: Use cough drops. * Feeling dehydrated: Drink extra liquids. If the air in your home is dry, use a humidifier. * Muscle aches, headache, and other pains: Often this comes and goes with the fever. Take acetaminophen every 4 to 6 hours (Adults 650 mg) OR ibuprofen every 6 to 8 hours (Adults 400 mg). Before taking any medicine, read all the instructions on the package. * Do not try to completely stop coughs that produce  mucus and phlegm. COUGH SYRUP WITH  DEXTROMETHORPHAN - EXTRA NOTES AND WARNINGS: * Coughing is helpful. It brings up the mucus from the lungs and helps prevent pneumonia. HUMIDIFIER: * If the air is dry, use a humidifier in the bedroom. * Dry air makes coughs worse. HOW TO PROTECT OTHERS - WHEN YOU ARE SICK WITH COVID-19: * STAY HOME A MINIMUM OF 10 DAYS: Home isolation is needed for at least 10 days after the symptoms started. Stay home from school or work if you are sick. Do NOT go to religious services, child care centers, shopping, or other public places. Do NOT use public transportation (e.g., bus, taxis, ridesharing). Do NOT allow any visitors to your home. Leave the house only if you need to seek urgent medical care. * COVER THE COUGH: Cough and sneeze into your shirt sleeve or inner elbow. Don't cough into your hand or the air. If available, cough into a tissue and throw it into a trash can. * WASH HANDS OFTEN: Wash hands often with soap and water. After coughing or sneezing are important times. If soap and water are not available, use an alcohol-based hand sanitizer with at least 60% alcohol, covering all surfaces of your hands and rubbing them together until they feel dry. Avoid touching your eyes, nose, and mouth with unwashed hands. STOPPING HOME ISOLATION - MUST MEET ALL 3 REQUIREMENTS (CDC): * Fever gone for at least 24 hours off fever-reducing medicines AND * Cough and other symptoms must be improved AND * Symptoms started more than 10 days ago. * If unsure if it is safe for you to leave isolation, check the CDC website or call your doctor (or NP/PA). CARE ADVICE given per COVID-19 - DIAGNOSED OR SUSPECTED (Adult) guideline. CALL BACK IF: * Fever over 103 F (39.4 C) * Fever lasts over 3 days * Fever returns after being gone for 24 hours * Chest pain or difficulty breathing occurs * You become worse

## 2020-12-26 ENCOUNTER — Telehealth: Payer: Self-pay

## 2020-12-26 NOTE — Telephone Encounter (Signed)
Aware, will see him then °

## 2020-12-26 NOTE — Telephone Encounter (Signed)
I spoke with pt; pt having rectal itching for several yrs during summer but itching more persistent. Pt has itching and redness of lt eye strting on 12/23/20. Pt does not see any blood with BM or otherwise and no abd pain; no vision problems. UC & ED precautions given and pt voiced understanding. Pt scheduled in office appt with Dr Milinda Antis on 12/30/20 at 9:30. No covid symptoms or known exposure. Sending note as FYI to Dr Milinda Antis.

## 2020-12-26 NOTE — Telephone Encounter (Signed)
[facility] Night - Client TELEPHONE ADVICE RECORD AccessNurse Patient Name: Richard Yates Gender: Male DOB: 1956/02/10 Age: 65 Y 10 M 29 D Return Phone Number: [phone] (Primary), [phone] (Secondary) Address: City/ State/ Zip: [place] [address] Client [facility] Night - Client Client Site [facility] [place] - Night Physician Tower, Idamae Schuller - MD Contact Type Call Who Is Calling Patient / Member / Family / Caregiver Call Type Triage / Clinical Relationship To Patient Self Return Phone Number [phone] (Primary) Chief Complaint Eye Pain Reason for Call Request to Schedule Office Appointment Initial Comment Caller asks to be seen for anal/rectal itching which is worsening. Also has redness/irration in left eye. Translation No No Triage Reason Patient declined Nurse Assessment Nurse: Freida Busman, RN, Diane Date/Time [name] Time): 12/26/2020 7:48:47 AM Confirm and document reason for call. If symptomatic, describe symptoms. ---Caller asks to be seen for anal/rectal itching which is worsening. Also has redness/irritation in left eye. Pt. declined triage. Just wants to call office for appt. Does the patient have any new or worsening symptoms? ---Yes Will a triage be completed? ---No Select reason for no triage. ---Patient declined Disp. Time [name] Time) Disposition Final User 12/26/2020 7:49:44 AM Clinical Call Yes Freida Busman, RN, Diane

## 2020-12-30 ENCOUNTER — Ambulatory Visit: Payer: 59 | Admitting: Family Medicine

## 2020-12-30 ENCOUNTER — Encounter: Payer: Self-pay | Admitting: Family Medicine

## 2020-12-30 ENCOUNTER — Other Ambulatory Visit: Payer: Self-pay

## 2020-12-30 VITALS — BP 134/86 | HR 77 | Temp 97.8°F | Ht 68.75 in | Wt 177.7 lb

## 2020-12-30 DIAGNOSIS — H1013 Acute atopic conjunctivitis, bilateral: Secondary | ICD-10-CM | POA: Diagnosis not present

## 2020-12-30 DIAGNOSIS — L290 Pruritus ani: Secondary | ICD-10-CM | POA: Diagnosis not present

## 2020-12-30 DIAGNOSIS — H1010 Acute atopic conjunctivitis, unspecified eye: Secondary | ICD-10-CM | POA: Insufficient documentation

## 2020-12-30 MED ORDER — OLOPATADINE HCL 0.2 % OP SOLN: 1.0000 [drp] | Freq: Every day | OPHTHALMIC | 11 refills | Status: DC

## 2020-12-30 NOTE — Assessment & Plan Note (Addendum)
Worse in the spring  Adv to not rub eyes if possible  Continue oph f/u for dry eyes and L epiretinal membrane  Olopatadine drops 0.2% once daily (1 drop each eye) Continue zyrtec unless it dries eyes too much  Avoid pollen when able Update if not starting to improve in a week or if worsening

## 2020-12-30 NOTE — Assessment & Plan Note (Signed)
Recurrent and worse in summer  Nl exam  No new GI symptoms  Disc bathing/use gentle soap if any and no hot water or scrubbing Wet wipes only if necessary and dry well  Cortisone cream 1% otc daily  Barrier cream like A and D Keep cool when possible Update if not starting to improve in a week or if worsening

## 2020-12-30 NOTE — Patient Instructions (Addendum)
Try the olopatadine eye drops 1 drop per eye once daily  Try not to rub/scratch  Avoid pollen when you can   Keep clean in anal area with water (very mild soap if any like dove)  No scrubbing  Dry very well after bathing or cleaning and after bowel movements  You can try cortisone cream 1% over the counter once daily  Also barrier cream like A and D as needed   If not improving please let me know

## 2020-12-30 NOTE — Progress Notes (Signed)
Subjective:    Patient ID: Richard Yates, male    DOB: 02-21-1956, 65 y.o.   MRN: [id number]  This visit occurred during the SARS-CoV-2 public health emergency.  Safety protocols were in place, including screening questions prior to the visit, additional usage of staff PPE, and extensive cleaning of exam room while observing appropriate contact time as indicated for disinfecting solutions.    HPI Pt presents with c/o anal itching and also eye problem  Wt Readings from Last 3 Encounters:  12/30/20 177 lb 11.2 oz (80.6 kg)  02/01/20 179 lb 3 oz (81.3 kg)  12/01/17 180 lb 12 oz (82 kg)   26.43 kg/m   Eye redness for a year (both eyes get itchy /gritty feeling)  Just L eye gets red  A week ago L eye (half) was very red  He has an epiretinal membrane in the L eye - ? 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) Worse this spring   He would clean with wash rag in shower and made himself raw (so stopped being so aggressive)  With a mirror-looked red and raw  Self treatment - used some eucerin cream  Topicals- tried cortisone cream otc Gold bond spray- helps some  TUKS pads - helped last fall  Antihistamines -takes zyrtec   STD history- none  Diet change- no new foods or beverage   Bowel habits- no leakage and no constipation  Has loose stool once in a while- otherwise pretty normal   Uses toilet paper    Last colonoscopy was 2015 Had some small internal hemorrhoids No straining  occ lifting heavy   Patient Active Problem List   Diagnosis Date Noted  . Allergic  conjunctivitis 12/30/2020  . Hyperkalemia 02/01/2020  . Urge incontinence 08/20/2016  . Neck pain, chronic 05/16/2015  . Numbness and tingling in hands 05/16/2015  . Prediabetes 04/17/2014  . Colon cancer screening 11/07/2013  . Frequent urination 07/10/2013  . Prostate cancer screening 07/10/2013  . Anal itching 03/28/2013  . Routine general medical examination at a health care facility 05/29/2012  . ALLERGIC RHINITIS 11/13/2008  . DERMATITIS, SEBORRHEIC 08/29/2008  . HEMORRHOIDS 06/07/2008  . Hyperlipidemia 02/02/2007  . GERD 02/02/2007   Past Medical History:  Diagnosis Date  . Allergic rhinitis, cause unspecified   . DDD (degenerative disc disease) 10/2000  . Esophageal reflux   . Pure hypercholesterolemia   . Rash and other nonspecific skin eruption   . Routine general medical examination at a health care facility   . Seborrheic dermatitis, unspecified   . Special screening for malignant neoplasm of prostate   . Unspecified gastritis and gastroduodenitis without mention of hemorrhage   . Unspecified hemorrhoids without mention of complication    Past Surgical History:  Procedure Laterality Date  . COLONOSCOPY  10/2003  . ESOPHAGOGASTRODUODENOSCOPY  10/2003   HH-had peptic bleeding, gastritis, neg H pylori  . MRI     Social History   Tobacco Use  . Smoking status: Never Smoker  . Smokeless tobacco: Never Used  Substance Use  Topics  . Alcohol use: No    Alcohol/week: 0.0 standard drinks  . Drug use: No   Family History  Problem Relation Age of Onset  . Diabetes Father        borderline; elevated PSA  . Hypertension Mother   . Hypertension Other        GM  . Prostate cancer Paternal Grandfather   . Stroke Maternal Grandfather   . Parkinson's disease Maternal Aunt   . Healthy Sister   . Healthy Daughter        x 3   Allergies  Allergen Reactions  . Penicillins Hives    REACTION: rash  . Atorvastatin     REACTION: muscle aches/headache   Current  Outpatient Medications on File Prior to Visit  Medication Sig Dispense Refill  . ASPIRIN 81 PO Take 81 mg by mouth 1 day or 1 dose.     Marland Kitchen Cetirizine HCl (ZYRTEC ALLERGY) 10 MG CAPS 1 day or 1 dose.    . fluticasone (FLONASE) 50 MCG/ACT nasal spray Place into both nostrils daily.    . Multiple Vitamin (MULTIVITAMIN) tablet Take 1 tablet by mouth daily.    . Omega-3 Fatty Acids (FISH OIL) 1200 MG CAPS Take 1 capsule by mouth 2 (two) times daily.    Marland Kitchen omeprazole (PRILOSEC) 20 MG capsule Take 20 mg by mouth daily.    . rosuvastatin (CRESTOR) 10 MG tablet Take 10 mg by mouth daily.     No current facility-administered medications on file prior to visit.    Review of Systems  Constitutional: Negative for activity change, appetite change, fatigue, fever and unexpected weight change.  HENT: Negative for congestion, rhinorrhea, sore throat and trouble swallowing.   Eyes: Positive for redness and itching. Negative for photophobia, pain, discharge and visual disturbance.  Respiratory: Negative for cough, chest tightness, shortness of breath and wheezing.   Cardiovascular: Negative for chest pain and palpitations.  Gastrointestinal: Negative for abdominal pain, blood in stool, constipation, diarrhea and nausea.  Endocrine: Negative for cold intolerance, heat intolerance, polydipsia and polyuria.  Genitourinary: Negative for difficulty urinating, dysuria, frequency and urgency.  Musculoskeletal: Negative for arthralgias, joint swelling and myalgias.  Skin: Negative for pallor and rash.       Anal itch  Neurological: Negative for dizziness, tremors, weakness, numbness and headaches.  Hematological: Negative for adenopathy. Does not bruise/bleed easily.  Psychiatric/Behavioral: Negative for decreased concentration and dysphoric mood. The patient is not nervous/anxious.        Objective:   Physical Exam Constitutional:      General: He is not in acute distress.    Appearance: Normal appearance. He  is normal weight. He is not ill-appearing or diaphoretic.  HENT:     Head: Normocephalic and atraumatic.  Eyes:     General: No scleral icterus.       Right eye: No discharge.        Left eye: No discharge.     Extraocular Movements: Extraocular movements intact.     Pupils: Pupils are equal, round, and reactive to light.     Comments: Very mild conjunctival injection bilat (laterally) No excessive tearing   Cardiovascular:     Rate and Rhythm: Normal rate and regular rhythm.  Pulmonary:     Effort: Pulmonary effort is normal. No respiratory distress.  Genitourinary:    Comments: Anal area appears normal  No erythema or excoriation  No external hemorrhoids One skin tag at 6:00  No evidence of open skin/fissure  or fistula  Musculoskeletal:     Cervical back: Neck supple.  Lymphadenopathy:     Cervical: No cervical adenopathy.  Skin:    Coloration: Skin is not jaundiced or pale.     Findings: No bruising, erythema, lesion or rash.  Neurological:     Mental Status: He is alert.     Cranial Nerves: No cranial nerve deficit.  Psychiatric:        Mood and Affect: Mood normal.           Assessment & Plan:   Problem List Items Addressed This Visit      Musculoskeletal and Integument   Anal itching - Primary    Recurrent and worse in summer  Nl exam  No new GI symptoms  Disc bathing/use gentle soap if any and no hot water or scrubbing Wet wipes only if necessary and dry well  Cortisone cream 1% otc daily  Barrier cream like A and D Keep cool when possible Update if not starting to improve in a week or if worsening          Other   Allergic conjunctivitis    Worse in the spring  Adv to not rub eyes if possible  Continue oph f/u for dry eyes and L epiretinal membrane  Olopatadine drops 0.2% once daily (1 drop each eye) Continue zyrtec unless it dries eyes too much  Avoid pollen when able Update if not starting to improve in a week or if worsening

## 2021-08-14 ENCOUNTER — Other Ambulatory Visit: Payer: Self-pay | Admitting: Family Medicine

## 2021-08-14 ENCOUNTER — Other Ambulatory Visit: Payer: Self-pay

## 2021-08-14 ENCOUNTER — Other Ambulatory Visit (INDEPENDENT_AMBULATORY_CARE_PROVIDER_SITE_OTHER): Payer: 59

## 2021-08-14 ENCOUNTER — Encounter: Payer: Self-pay | Admitting: Family Medicine

## 2021-08-14 ENCOUNTER — Telehealth (INDEPENDENT_AMBULATORY_CARE_PROVIDER_SITE_OTHER): Payer: 59 | Admitting: Family Medicine

## 2021-08-14 DIAGNOSIS — J069 Acute upper respiratory infection, unspecified: Secondary | ICD-10-CM | POA: Diagnosis not present

## 2021-08-14 LAB — POC COVID19 BINAXNOW: SARS Coronavirus 2 Ag: NEGATIVE

## 2021-08-14 LAB — POC INFLUENZA A&B (BINAX/QUICKVUE)
Influenza A, POC: POSITIVE — AB
Influenza B, POC: NEGATIVE

## 2021-08-14 NOTE — Addendum Note (Signed)
Addended by: Alvina Chou on: 08/14/2021 02:49 PM   Modules accepted: Orders

## 2021-08-14 NOTE — Progress Notes (Signed)
Virtual Visit via Video Note  I connected with [name] on 08/14/21 at 12:00 PM EST by a video enabled telemedicine application and verified that I am speaking with the correct person using two identifiers.  Location: Patient: home Provider: office    I discussed the limitations of evaluation and management by telemedicine and the availability of in person appointments. The patient expressed understanding and agreed to proceed.  Parties involved in encounter  Patient: Richard Yates  Wife: Dan Europe  Provider:  Roxy Manns MD   History of Present Illness: Pt presents with c/o congestion, cough and fever   Symptoms started wed am  Cough then nose running and headache   Achey/skin hurt- fever (left work)  101 temp that night with some chills also   Yesterday temp stayed under 100  Now 100.4  This am cough Clear mucous if any (from nose and chest)  No wheeze No sob  Today -headache is better   Throat-ok  Ears- full   No n/v/d    Otc:  Mucinex bid 12 hour  3 advil and one ES tylenol every 6 hours while awake  Delsym    Had some out of date covid tests  Took several -both negative   Had whole family xmas eve- son was sick and he had pos covid test    Lab Results  Component Value Date   CREATININE 1.08 02/01/2020   BUN 16 02/01/2020   NA 134 (L) 02/01/2020   K 4.6 02/01/2020   CL 102 02/01/2020   CO2 30 02/01/2020   Patient Active Problem List   Diagnosis Date Noted   Viral URI with cough 08/14/2021   Allergic conjunctivitis 12/30/2020   Hyperkalemia 02/01/2020   Urge incontinence 08/20/2016   Neck pain, chronic 05/16/2015   Numbness and tingling in hands 05/16/2015   Prediabetes 04/17/2014   Colon cancer screening 11/07/2013   Frequent urination 07/10/2013   Prostate cancer screening 07/10/2013   Anal itching 03/28/2013   Routine general medical examination at a health care facility 05/29/2012   ALLERGIC RHINITIS 11/13/2008    DERMATITIS, SEBORRHEIC 08/29/2008   HEMORRHOIDS 06/07/2008   Hyperlipidemia 02/02/2007   GERD 02/02/2007   Past Medical History:  Diagnosis Date   Allergic rhinitis, cause unspecified    DDD (degenerative disc disease) 10/2000   Esophageal reflux    Pure hypercholesterolemia    Rash and other nonspecific skin eruption    Routine general medical examination at a health care facility    Seborrheic dermatitis, unspecified    Special screening for malignant neoplasm of prostate    Unspecified gastritis and gastroduodenitis without mention of hemorrhage    Unspecified hemorrhoids without mention of complication    Past Surgical History:  Procedure Laterality Date   COLONOSCOPY  10/2003   ESOPHAGOGASTRODUODENOSCOPY  10/2003   HH-had peptic bleeding, gastritis, neg H pylori   MRI     Social History   Tobacco Use   Smoking status: Never   Smokeless tobacco: Never  Substance Use Topics   Alcohol use: No    Alcohol/week: 0.0 standard drinks   Drug use: No   Family History  Problem Relation Age of Onset   Diabetes Father        borderline; elevated PSA   Hypertension Mother    Hypertension Other        GM   Prostate cancer Paternal Grandfather    Stroke Maternal Grandfather    Parkinson's disease Maternal Aunt  Healthy Sister    Healthy Daughter        x 3   Allergies  Allergen Reactions   Penicillins Hives    REACTION: rash   Atorvastatin     REACTION: muscle aches/headache   Current Outpatient Medications on File Prior to Visit  Medication Sig Dispense Refill   ASPIRIN 81 PO Take 81 mg by mouth 1 day or 1 dose.      Cetirizine HCl (ZYRTEC ALLERGY) 10 MG CAPS 1 day or 1 dose.     Multiple Vitamin (MULTIVITAMIN) tablet Take 1 tablet by mouth daily.     Olopatadine HCl 0.2 % SOLN Apply 1 drop to eye daily. 2.5 mL 11   Omega-3 Fatty Acids (FISH OIL) 1200 MG CAPS Take 1 capsule by mouth daily.     omeprazole (PRILOSEC) 20 MG capsule Take 20 mg by mouth daily.      rosuvastatin (CRESTOR) 10 MG tablet Take 10 mg by mouth daily.     No current facility-administered medications on file prior to visit.   Review of Systems  Constitutional:  Positive for fever and malaise/fatigue. Negative for chills.  HENT:  Positive for congestion and sore throat. Negative for ear pain and sinus pain.   Eyes:  Negative for blurred vision, discharge and redness.  Respiratory:  Positive for cough and sputum production. Negative for shortness of breath, wheezing and stridor.   Cardiovascular:  Negative for chest pain, palpitations and leg swelling.  Gastrointestinal:  Negative for abdominal pain, diarrhea, nausea and vomiting.  Musculoskeletal:  Negative for myalgias.  Skin:  Negative for rash.  Neurological:  Positive for headaches. Negative for dizziness.     Observations/Objective: Patient appears well, in no distress Weight is baseline  No facial swelling or asymmetry Normal voice-not hoarse and no slurred speech No obvious tremor or mobility impairment Moving neck and UEs normally Able to hear the call well  No cough or shortness of breath during interview  Pt does clear throat occasionally Talkative and mentally sharp with no cognitive changes No skin changes on face or neck , no rash or pallor Affect is normal    Assessment and Plan: Problem List Items Addressed This Visit       Respiratory   Viral URI with cough    With fever but no sob or wheezing  Was exp to family member with covid  Ordered swab for covid and flu (rapid and pcr) Also bmet labs in case he has covid and needs paxlovid  Adv rest /fluids Symptom care = mucinex and delsym and analgesic Can try nasal saline also  Update if not starting to improve in a week or if worsening  ER precautions discussed         Follow Up Instructions: Drink fluids and rest  Continue treating symptoms   The office will call you to set up swabbing tests for covid and flu and labs   We will alert you  with results   If symptoms suddenly worsen or you become short of breath please alert [place] and go to the ER   I discussed the assessment and treatment plan with the patient. The patient was provided an opportunity to ask questions and all were answered. The patient agreed with the plan and demonstrated an understanding of the instructions.   The patient was advised to call back or seek an in-person evaluation if the symptoms worsen or if the condition fails to improve as anticipated.     [employer]  Milinda Antis, MD

## 2021-08-14 NOTE — Assessment & Plan Note (Signed)
With fever but no sob or wheezing  Was exp to family member with covid  Ordered swab for covid and flu (rapid and pcr) Also bmet labs in case he has covid and needs paxlovid  Adv rest /fluids Symptom care = mucinex and delsym and analgesic Can try nasal saline also  Update if not starting to improve in a week or if worsening  ER precautions discussed

## 2021-08-14 NOTE — Patient Instructions (Signed)
Drink fluids and rest  Continue treating symptoms   The office will call you to set up swabbing tests for covid and flu and labs   We will alert you with results   If symptoms suddenly worsen or you become short of breath please alert [place] and go to the ER

## 2021-08-15 LAB — SARS-COV-2, NAA 2 DAY TAT

## 2021-08-15 LAB — NOVEL CORONAVIRUS, NAA: SARS-CoV-2, NAA: NOT DETECTED

## 2021-11-25 ENCOUNTER — Encounter: Payer: 59 | Admitting: Family Medicine

## 2021-11-29 ENCOUNTER — Telehealth: Payer: Self-pay | Admitting: Family Medicine

## 2021-11-29 DIAGNOSIS — Z0000 Encounter for general adult medical examination without abnormal findings: Secondary | ICD-10-CM

## 2021-11-29 DIAGNOSIS — E7800 Pure hypercholesterolemia, unspecified: Secondary | ICD-10-CM

## 2021-11-29 DIAGNOSIS — Z125 Encounter for screening for malignant neoplasm of prostate: Secondary | ICD-10-CM

## 2021-11-29 NOTE — Telephone Encounter (Signed)
-----   Message from Ronalee Red, RT sent at 11/16/2021  9:04 AM EDT ----- ?Regarding: Lab orders for appt on Monday, November 30, 2021 ?Patient is scheduled for cpx, please order future labs.  Thanks, Jae Dire  ? ?

## 2021-11-30 ENCOUNTER — Other Ambulatory Visit (INDEPENDENT_AMBULATORY_CARE_PROVIDER_SITE_OTHER): Payer: 59

## 2021-11-30 DIAGNOSIS — Z125 Encounter for screening for malignant neoplasm of prostate: Secondary | ICD-10-CM

## 2021-11-30 DIAGNOSIS — Z0000 Encounter for general adult medical examination without abnormal findings: Secondary | ICD-10-CM

## 2021-11-30 DIAGNOSIS — E7800 Pure hypercholesterolemia, unspecified: Secondary | ICD-10-CM

## 2021-11-30 LAB — COMPREHENSIVE METABOLIC PANEL
ALT: 19 U/L (ref 0–53)
AST: 20 U/L (ref 0–37)
Albumin: 4.5 g/dL (ref 3.5–5.2)
Alkaline Phosphatase: 70 U/L (ref 39–117)
BUN: 15 mg/dL (ref 6–23)
CO2: 29 mEq/L (ref 19–32)
Calcium: 9.7 mg/dL (ref 8.4–10.5)
Chloride: 101 mEq/L (ref 96–112)
Creatinine, Ser: 1.11 mg/dL (ref 0.40–1.50)
GFR: 70.01 mL/min (ref >60.00)
Glucose, Bld: 112 mg/dL — ABNORMAL HIGH (ref 70–99)
Potassium: 4.6 mEq/L (ref 3.5–5.1)
Sodium: 136 mEq/L (ref 135–145)
Total Bilirubin: 1.1 mg/dL (ref 0.2–1.2)
Total Protein: 6.9 g/dL (ref 6.0–8.3)

## 2021-11-30 LAB — LIPID PANEL
Cholesterol: 162 mg/dL (ref 0–200)
HDL: 40.8 mg/dL (ref >39.00)
LDL Cholesterol: 102 mg/dL — ABNORMAL HIGH (ref 0–99)
NonHDL: 121.66
Total CHOL/HDL Ratio: 4
Triglycerides: 100 mg/dL (ref 0.0–149.0)
VLDL: 20 mg/dL (ref 0.0–40.0)

## 2021-11-30 LAB — TSH: TSH: 2.54 u[IU]/mL (ref 0.35–5.50)

## 2021-11-30 LAB — CBC WITH DIFFERENTIAL/PLATELET
Basophils Absolute: 0 10*3/uL (ref 0.0–0.1)
Basophils Relative: 0.5 % (ref 0.0–3.0)
Eosinophils Absolute: 0.2 10*3/uL (ref 0.0–0.7)
Eosinophils Relative: 3.1 % (ref 0.0–5.0)
HCT: 42.4 % (ref 39.0–52.0)
Hemoglobin: 14.5 g/dL (ref 13.0–17.0)
Lymphocytes Relative: 21.5 % (ref 12.0–46.0)
Lymphs Abs: 1.2 10*3/uL (ref 0.7–4.0)
MCHC: 34.2 g/dL (ref 30.0–36.0)
MCV: 89.3 fl (ref 78.0–100.0)
Monocytes Absolute: 0.3 10*3/uL (ref 0.1–1.0)
Monocytes Relative: 5.3 % (ref 3.0–12.0)
Neutro Abs: 3.8 10*3/uL (ref 1.4–7.7)
Neutrophils Relative %: 69.6 % (ref 43.0–77.0)
Platelets: 293 10*3/uL (ref 150.0–400.0)
RBC: 4.75 Mil/uL (ref 4.22–5.81)
RDW: 13.2 % (ref 11.5–15.5)
WBC: 5.5 10*3/uL (ref 4.0–10.5)

## 2021-11-30 LAB — PSA: PSA: 3.03 ng/mL (ref 0.10–4.00)

## 2021-12-07 ENCOUNTER — Ambulatory Visit (INDEPENDENT_AMBULATORY_CARE_PROVIDER_SITE_OTHER): Payer: 59 | Admitting: Family Medicine

## 2021-12-07 ENCOUNTER — Encounter: Payer: Self-pay | Admitting: Family Medicine

## 2021-12-07 VITALS — BP 132/80 | HR 82 | Temp 97.3°F | Ht 68.75 in | Wt 184.0 lb

## 2021-12-07 DIAGNOSIS — E7800 Pure hypercholesterolemia, unspecified: Secondary | ICD-10-CM

## 2021-12-07 DIAGNOSIS — R350 Frequency of micturition: Secondary | ICD-10-CM

## 2021-12-07 DIAGNOSIS — Z0000 Encounter for general adult medical examination without abnormal findings: Secondary | ICD-10-CM

## 2021-12-07 DIAGNOSIS — R7303 Prediabetes: Secondary | ICD-10-CM | POA: Diagnosis not present

## 2021-12-07 DIAGNOSIS — Z125 Encounter for screening for malignant neoplasm of prostate: Secondary | ICD-10-CM

## 2021-12-07 DIAGNOSIS — Z1211 Encounter for screening for malignant neoplasm of colon: Secondary | ICD-10-CM | POA: Diagnosis not present

## 2021-12-07 DIAGNOSIS — M25521 Pain in right elbow: Secondary | ICD-10-CM | POA: Insufficient documentation

## 2021-12-07 NOTE — Assessment & Plan Note (Signed)
Reviewed health habits including diet and exercise and skin cancer prevention ?Reviewed appropriate screening tests for age  ?Also reviewed health mt list, fam hx and immunization status , as well as social and family history   ?See HPI ?Labs reviewed  ?Colonoscopy utd ?psa up slt -referral to urology  ?Declines shingrix for now/may consider later  ?Encouraged better diet and more exercise  ?Will return for elbow problem if no imp with voltaren gel ?

## 2021-12-07 NOTE — Progress Notes (Signed)
? ?Subjective:  ? ? Patient ID: Richard Yates, male    DOB: 11-Nov-1955, 66 y.o.   MRN: [id number] ? ?HPI ?Here for health maintenance exam and to review chronic medical problems   ? ?Wt Readings from Last 3 Encounters:  ?12/07/21 184 lb (83.5 kg)  ?08/14/21 175 lb (79.4 kg)  ?12/30/20 177 lb 11.2 oz (80.6 kg)  ? ?27.37 kg/m? ? ?Doing well  ?Thinking about retirement -is stressed out  ?Thinking about this summer  ?Will sign up for part A of medicare  ? ?Has had some issues with arms and elbows - medial  ?Has been cutting wood/taking trees down  ? ?R elbow hurt  ?Gradually getting better  ?Delivering mail worsens it  ? ?Took 3 ibuprofen -helps some  ? ? ?Colonoscopy 05/2014 ? ? ?Immunization History  ?Administered Date(s) Administered  ? Hepatitis A, Adult 02/13/2015  ? Influenza Split 06/07/2012  ? Influenza, Seasonal, Injecte, Preservative Fre 07/17/2015  ? Influenza,inj,Quad PF,6+ Mos 07/10/2013, 08/20/2016  ? PFIZER(Purple Top)SARS-COV-2 Vaccination 10/29/2019, 11/20/2019  ? Td 05/16/2005  ? Tdap 02/13/2015  ? Typhoid Live 11/21/2014  ? Yellow Fever 11/21/2014  ? ?Zoster status : unsure if he wants shingrix yet  ? ? ? ?Prostate health  ?Lab Results  ?Component Value Date  ? PSA 3.03 11/30/2021  ? PSA 1.14 01/28/2020  ? PSA 1.94 08/20/2016  ? ?GF had prostate cancer  ?No changes in urination  ?Nocturia times 2 - not a big change from baseline 1-2 times  ? ?More frequent urination than past ?Saw urology in the past  ? ? ?BP Readings from Last 3 Encounters:  ?12/07/21 (!) 142/82  ?08/14/21 132/68  ?12/30/20 134/86  ? ? ?Hyperlipidemia  ?Lab Results  ?Component Value Date  ? CHOL 162 11/30/2021  ? CHOL 146 01/28/2020  ? CHOL 187 12/22/2016  ? ?Lab Results  ?Component Value Date  ? HDL 40.80 11/30/2021  ? HDL 42.00 01/28/2020  ? HDL 48.60 12/22/2016  ? ?Lab Results  ?Component Value Date  ? LDLCALC 102 (H) 11/30/2021  ? LDLCALC 85 01/28/2020  ? LDLCALC 119 (H) 12/22/2016  ? ?Lab Results  ?Component Value Date  ? TRIG  100.0 11/30/2021  ? TRIG 92.0 01/28/2020  ? TRIG 99.0 12/22/2016  ? ?Lab Results  ?Component Value Date  ? CHOLHDL 4 11/30/2021  ? CHOLHDL 3 01/28/2020  ? CHOLHDL 4 12/22/2016  ? ?Lab Results  ?Component Value Date  ? LDLDIRECT 162.2 12/13/2012  ? LDLDIRECT 150.7 09/08/2012  ? LDLDIRECT 167.9 05/30/2012  ? ?Crestor 10 mg daily  ?Not watching diet as well  ?Some fried food and red meat/bkfast meat  ? ?Prediabetes ?112 glucose  ?Is a sweet eater /has to watch that  ?If it is in the house /he eats it  ? ?Lab Results  ?Component Value Date  ? CREATININE 1.11 11/30/2021  ? BUN 15 11/30/2021  ? NA 136 11/30/2021  ? K 4.6 11/30/2021  ? CL 101 11/30/2021  ? CO2 29 11/30/2021  ? ?Lab Results  ?Component Value Date  ? ALT 19 11/30/2021  ? AST 20 11/30/2021  ? ALKPHOS 70 11/30/2021  ? BILITOT 1.1 11/30/2021  ? ?Lab Results  ?Component Value Date  ? WBC 5.5 11/30/2021  ? HGB 14.5 11/30/2021  ? HCT 42.4 11/30/2021  ? MCV 89.3 11/30/2021  ? PLT 293.0 11/30/2021  ? ?Lab Results  ?Component Value Date  ? TSH 2.54 11/30/2021  ?  ?Patient Active Problem List  ? Diagnosis  Date Noted  ? Right elbow pain 12/07/2021  ? Allergic conjunctivitis 12/30/2020  ? Hyperkalemia 02/01/2020  ? Urge incontinence 08/20/2016  ? Neck pain, chronic 05/16/2015  ? Prediabetes 04/17/2014  ? Colon cancer screening 11/07/2013  ? Frequent urination 07/10/2013  ? Prostate cancer screening 07/10/2013  ? Routine general medical examination at a health care facility 05/29/2012  ? ALLERGIC RHINITIS 11/13/2008  ? DERMATITIS, SEBORRHEIC 08/29/2008  ? HEMORRHOIDS 06/07/2008  ? Hyperlipidemia 02/02/2007  ? GERD 02/02/2007  ? ?Past Medical History:  ?Diagnosis Date  ? Allergic rhinitis, cause unspecified   ? DDD (degenerative disc disease) 10/2000  ? Esophageal reflux   ? Pure hypercholesterolemia   ? Rash and other nonspecific skin eruption   ? Routine general medical examination at a health care facility   ? Seborrheic dermatitis, unspecified   ? Special screening for  malignant neoplasm of prostate   ? Unspecified gastritis and gastroduodenitis without mention of hemorrhage   ? Unspecified hemorrhoids without mention of complication   ? ?Past Surgical History:  ?Procedure Laterality Date  ? COLONOSCOPY  10/2003  ? ESOPHAGOGASTRODUODENOSCOPY  10/2003  ? HH-had peptic bleeding, gastritis, neg H pylori  ? MRI    ? ?Social History  ? ?Tobacco Use  ? Smoking status: Never  ? Smokeless tobacco: Never  ?Substance Use Topics  ? Alcohol use: No  ?  Alcohol/week: 0.0 standard drinks  ? Drug use: No  ? ?Family History  ?Problem Relation Age of Onset  ? Diabetes Father   ?     borderline; elevated PSA  ? Hypertension Mother   ? Hypertension Other   ?     GM  ? Prostate cancer Paternal Grandfather   ? Stroke Maternal Grandfather   ? Parkinson's disease Maternal Aunt   ? Healthy Sister   ? Healthy Daughter   ?     x 3  ? ?Allergies  ?Allergen Reactions  ? Penicillins Hives  ?  REACTION: rash  ? Atorvastatin   ?  REACTION: muscle aches/headache  ? ?Current Outpatient Medications on File Prior to Visit  ?Medication Sig Dispense Refill  ? ASPIRIN 81 PO Take 81 mg by mouth 1 day or 1 dose.     ? Cetirizine HCl (ZYRTEC ALLERGY) 10 MG CAPS 1 day or 1 dose.    ? Multiple Vitamin (MULTIVITAMIN) tablet Take 1 tablet by mouth daily.    ? Omega-3 Fatty Acids (FISH OIL) 1200 MG CAPS Take 1 capsule by mouth daily.    ? omeprazole (PRILOSEC) 20 MG capsule Take 20 mg by mouth daily.    ? rosuvastatin (CRESTOR) 10 MG tablet Take 10 mg by mouth daily.    ? ?No current facility-administered medications on file prior to visit.  ?  ? ? ?Review of Systems  ?Constitutional:  Negative for activity change, appetite change, fatigue, fever and unexpected weight change.  ?HENT:  Negative for congestion, rhinorrhea, sore throat and trouble swallowing.   ?Eyes:  Negative for pain, redness, itching and visual disturbance.  ?Respiratory:  Negative for cough, chest tightness, shortness of breath and wheezing.    ?Cardiovascular:  Negative for chest pain and palpitations.  ?Gastrointestinal:  Negative for abdominal pain, blood in stool, constipation, diarrhea and nausea.  ?Endocrine: Negative for cold intolerance, heat intolerance, polydipsia and polyuria.  ?Genitourinary:  Negative for difficulty urinating, dysuria, frequency and urgency.  ?Musculoskeletal:  Negative for arthralgias, joint swelling and myalgias.  ?     Right elbow hurts  ?Skin:  Negative for pallor and rash.  ?Neurological:  Negative for dizziness, tremors, weakness, numbness and headaches.  ?Hematological:  Negative for adenopathy. Does not bruise/bleed easily.  ?Psychiatric/Behavioral:  Negative for decreased concentration and dysphoric mood. The patient is not nervous/anxious.   ? ?   ?Objective:  ? Physical Exam ?Constitutional:   ?   General: He is not in acute distress. ?   Appearance: Normal appearance. He is well-developed and normal weight. He is not ill-appearing or diaphoretic.  ?HENT:  ?   Head: Normocephalic and atraumatic.  ?   Right Ear: Tympanic membrane, ear canal and external ear normal.  ?   Left Ear: Tympanic membrane, ear canal and external ear normal.  ?   Nose: Nose normal. No congestion.  ?   Mouth/Throat:  ?   Mouth: Mucous membranes are moist.  ?   Pharynx: Oropharynx is clear. No posterior oropharyngeal erythema.  ?Eyes:  ?   General: No scleral icterus.    ?   Right eye: No discharge.     ?   Left eye: No discharge.  ?   Conjunctiva/sclera: Conjunctivae normal.  ?   Pupils: Pupils are equal, round, and reactive to light.  ?Neck:  ?   Thyroid: No thyromegaly.  ?   Vascular: No carotid bruit or JVD.  ?Cardiovascular:  ?   Rate and Rhythm: Normal rate and regular rhythm.  ?   Pulses: Normal pulses.  ?   Heart sounds: Normal heart sounds.  ?  No gallop.  ?Pulmonary:  ?   Effort: Pulmonary effort is normal. No respiratory distress.  ?   Breath sounds: Normal breath sounds. No wheezing or rales.  ?   Comments: Good air exch ?Chest:  ?    Chest wall: No tenderness.  ?Abdominal:  ?   General: Bowel sounds are normal. There is no distension or abdominal bruit.  ?   Palpations: Abdomen is soft. There is no mass.  ?   Tenderness: There is no abdomina

## 2021-12-07 NOTE — Assessment & Plan Note (Signed)
Disc goals for lipids and reasons to control them ?Rev last labs with pt ?Rev low sat fat diet in detail ?LDL is up to 102 ?Taking crestor 10 mg daily (gets from a family member's practice, we do not provide) ?Will work harder on diet ?

## 2021-12-07 NOTE — Assessment & Plan Note (Signed)
Lab Results  ?Component Value Date  ? PSA 3.03 11/30/2021  ? PSA 1.14 01/28/2020  ? PSA 1.94 08/20/2016  ? ? ?Some inc in noctura/frequency ?Referred to urology for f/u . ? ?

## 2021-12-07 NOTE — Assessment & Plan Note (Signed)
Lateral ?After chopping wood  ?Improved with stopping above ? ?Recommend voltaren gel ?Ice  ?Relative rest ?If not imp /consider sport med visit ?

## 2021-12-07 NOTE — Assessment & Plan Note (Signed)
Glucose 112 ?More sugar in diet lately  ?disc imp of low glycemic diet and wt loss to prevent DM2  ?

## 2021-12-07 NOTE — Assessment & Plan Note (Signed)
With nocturia times 2 ?psa is up to 3.03 from1.1  ?Ref made to urology for a f/u  ? ?Was prev on vesicare ?

## 2021-12-07 NOTE — Patient Instructions (Addendum)
Voltaren gel 1% - try that on elbow instead of the ibuprofen  ?Ice also  ? ? ?If you are interested in the shingles vaccine series (Shingrix), call your insurance or pharmacy to check on coverage and location it must be given.  If affordable - you can schedule it here or at your pharmacy depending on coverage  ? ?I placed a referral to urology  ?You will get a call to schedule that  ? ?Avoid red meat/ fried foods/ egg yolks/ fatty breakfast meats/ butter, cheese and high fat dairy/ and shellfish  ? ?To prevent diabetes ?Try to get most of your carbohydrates from produce (with the exception of white potatoes)  ?Eat less bread/pasta/rice/snack foods/cereals/sweets and other items from the middle of the grocery store (processed carbs) ? ?Aim for more exercise 30 or more minutes daily  ?

## 2021-12-07 NOTE — Assessment & Plan Note (Signed)
Colonoscopy 2015  ?

## 2021-12-18 ENCOUNTER — Encounter: Payer: Self-pay | Admitting: *Deleted

## 2021-12-30 ENCOUNTER — Telehealth: Payer: Self-pay | Admitting: Family Medicine

## 2021-12-30 NOTE — Telephone Encounter (Signed)
Pt has an upcoming appointment with [facility], pt needs labs done prior to that. Appointment has been made, just need the orders in. Pt last seen 4.24.23 ?

## 2021-12-30 NOTE — Telephone Encounter (Signed)
We usually don't do other providers labs if they are not in [place] but will route message to PCP for review  ?

## 2021-12-30 NOTE — Telephone Encounter (Signed)
For legal reasons we are not supposed to do this.   Usually folks get them done at [facility].  If it is a lab I really need as well for another diagnosis that would be different and we have to have a visit to document.  ?

## 2021-12-31 NOTE — Telephone Encounter (Signed)
Left VM requesting pt to call the office back 

## 2022-01-15 NOTE — Telephone Encounter (Signed)
Left VM requesting pt to call the office back 

## 2022-06-23 ENCOUNTER — Telehealth: Payer: Self-pay | Admitting: Family Medicine

## 2022-06-23 DIAGNOSIS — E7800 Pure hypercholesterolemia, unspecified: Secondary | ICD-10-CM

## 2022-06-23 DIAGNOSIS — R7303 Prediabetes: Secondary | ICD-10-CM

## 2022-06-23 NOTE — Telephone Encounter (Signed)
-----   Message from Alvina Chou sent at 06/11/2022 10:44 AM EDT ----- Regarding: Lab orders for Thursday, 11.9.23 Lab orders,thanks

## 2022-06-24 ENCOUNTER — Encounter: Payer: Self-pay | Admitting: *Deleted

## 2022-06-24 ENCOUNTER — Other Ambulatory Visit (INDEPENDENT_AMBULATORY_CARE_PROVIDER_SITE_OTHER): Payer: Medicare Other

## 2022-06-24 DIAGNOSIS — R7303 Prediabetes: Secondary | ICD-10-CM | POA: Diagnosis not present

## 2022-06-24 DIAGNOSIS — E7800 Pure hypercholesterolemia, unspecified: Secondary | ICD-10-CM

## 2022-06-24 LAB — HEMOGLOBIN A1C: Hgb A1c MFr Bld: 6.1 % (ref 4.6–6.5)

## 2022-06-24 LAB — BASIC METABOLIC PANEL
BUN: 17 mg/dL (ref 6–23)
CO2: 27 mEq/L (ref 19–32)
Calcium: 9.3 mg/dL (ref 8.4–10.5)
Chloride: 103 mEq/L (ref 96–112)
Creatinine, Ser: 1.07 mg/dL (ref 0.40–1.50)
GFR: 72.88 mL/min (ref >60.00)
Glucose, Bld: 101 mg/dL — ABNORMAL HIGH (ref 70–99)
Potassium: 4.5 mEq/L (ref 3.5–5.1)
Sodium: 136 mEq/L (ref 135–145)

## 2022-06-24 LAB — LIPID PANEL
Cholesterol: 145 mg/dL (ref 0–200)
HDL: 46.6 mg/dL (ref >39.00)
LDL Cholesterol: 81 mg/dL (ref 0–99)
NonHDL: 98.3
Total CHOL/HDL Ratio: 3
Triglycerides: 87 mg/dL (ref 0.0–149.0)
VLDL: 17.4 mg/dL (ref 0.0–40.0)

## 2022-06-27 ENCOUNTER — Encounter: Payer: Self-pay | Admitting: Family Medicine

## 2022-06-28 ENCOUNTER — Telehealth: Payer: Self-pay

## 2022-06-28 ENCOUNTER — Telehealth: Payer: Self-pay | Admitting: Family Medicine

## 2022-06-28 DIAGNOSIS — Z125 Encounter for screening for malignant neoplasm of prostate: Secondary | ICD-10-CM

## 2022-06-28 DIAGNOSIS — R350 Frequency of micturition: Secondary | ICD-10-CM

## 2022-06-28 NOTE — Telephone Encounter (Signed)
Patient called to see if he had got PSA done on 06/24/2022. Call back number [phone].

## 2022-06-28 NOTE — Telephone Encounter (Signed)
Spoke w/ pt he sees his urologist this Thursday the 16th and he was under the impression they needed the PSA lab. I told him I would see if you would like to add the lab and I could get him scheduled for a lab visit before his appt, I also encouraged him to reach out to his urologist to see if they absolutely need that lab before his appt and he stated he will CB and let us know.

## 2022-06-28 NOTE — Telephone Encounter (Signed)
I ordered it  Lab does not need to be fasting   Last one was done in April (usually yearly which is why we don't have another one since then) Happy to do it though

## 2022-06-28 NOTE — Telephone Encounter (Signed)
LMOM for pt to CB in regards to his call about PSA lab.

## 2022-06-28 NOTE — Telephone Encounter (Signed)
Called to inform pt and to get him scheduled for the lab he stated he actually spoke w/ his urologist and he stated it was no big deal if he got the lab done before or not they stated if he did not get it done here they could do it at their office. Pt stated he will just get it done at their office so he only has one trip.

## 2022-06-28 NOTE — Telephone Encounter (Signed)
LMOM for pt to CB.  

## 2022-06-28 NOTE — Telephone Encounter (Signed)
Aware, that sounds good

## 2022-07-01 DIAGNOSIS — Z125 Encounter for screening for malignant neoplasm of prostate: Secondary | ICD-10-CM | POA: Diagnosis not present

## 2022-07-01 DIAGNOSIS — R972 Elevated prostate specific antigen [PSA]: Secondary | ICD-10-CM | POA: Diagnosis not present

## 2022-07-01 DIAGNOSIS — N400 Enlarged prostate without lower urinary tract symptoms: Secondary | ICD-10-CM | POA: Diagnosis not present

## 2023-01-06 DIAGNOSIS — L814 Other melanin hyperpigmentation: Secondary | ICD-10-CM | POA: Diagnosis not present

## 2023-01-06 DIAGNOSIS — L821 Other seborrheic keratosis: Secondary | ICD-10-CM | POA: Diagnosis not present

## 2023-01-06 DIAGNOSIS — D2272 Melanocytic nevi of left lower limb, including hip: Secondary | ICD-10-CM | POA: Diagnosis not present

## 2023-01-06 DIAGNOSIS — D225 Melanocytic nevi of trunk: Secondary | ICD-10-CM | POA: Diagnosis not present

## 2023-02-22 ENCOUNTER — Telehealth: Payer: Self-pay | Admitting: Family Medicine

## 2023-02-22 DIAGNOSIS — Z125 Encounter for screening for malignant neoplasm of prostate: Secondary | ICD-10-CM

## 2023-02-22 DIAGNOSIS — E7800 Pure hypercholesterolemia, unspecified: Secondary | ICD-10-CM

## 2023-02-22 DIAGNOSIS — Z0000 Encounter for general adult medical examination without abnormal findings: Secondary | ICD-10-CM

## 2023-02-22 DIAGNOSIS — R7303 Prediabetes: Secondary | ICD-10-CM

## 2023-02-22 NOTE — Telephone Encounter (Signed)
-----   Message from Lovena Neighbours, RT sent at 02/10/2023 12:44 PM EDT ----- Regarding: Labs for 7.10.24 Patient is scheduled for CPX labs, please order future labs, Thank you. Denny Peon

## 2023-02-23 ENCOUNTER — Other Ambulatory Visit (INDEPENDENT_AMBULATORY_CARE_PROVIDER_SITE_OTHER): Payer: Medicare Other

## 2023-02-23 DIAGNOSIS — Z125 Encounter for screening for malignant neoplasm of prostate: Secondary | ICD-10-CM

## 2023-02-23 DIAGNOSIS — R350 Frequency of micturition: Secondary | ICD-10-CM

## 2023-02-23 DIAGNOSIS — Z0000 Encounter for general adult medical examination without abnormal findings: Secondary | ICD-10-CM

## 2023-02-23 DIAGNOSIS — R7303 Prediabetes: Secondary | ICD-10-CM | POA: Diagnosis not present

## 2023-02-23 DIAGNOSIS — E7800 Pure hypercholesterolemia, unspecified: Secondary | ICD-10-CM

## 2023-02-23 LAB — LIPID PANEL
Cholesterol: 158 mg/dL (ref 0–200)
HDL: 40 mg/dL (ref >39.00)
LDL Cholesterol: 96 mg/dL (ref 0–99)
NonHDL: 118.47
Total CHOL/HDL Ratio: 4
Triglycerides: 114 mg/dL (ref 0.0–149.0)
VLDL: 22.8 mg/dL (ref 0.0–40.0)

## 2023-02-23 LAB — CBC WITH DIFFERENTIAL/PLATELET
Basophils Absolute: 0 10*3/uL (ref 0.0–0.1)
Basophils Relative: 0.7 % (ref 0.0–3.0)
Eosinophils Absolute: 0.2 10*3/uL (ref 0.0–0.7)
Eosinophils Relative: 3.2 % (ref 0.0–5.0)
HCT: 43.4 % (ref 39.0–52.0)
Hemoglobin: 14.4 g/dL (ref 13.0–17.0)
Lymphocytes Relative: 22.4 % (ref 12.0–46.0)
Lymphs Abs: 1.5 10*3/uL (ref 0.7–4.0)
MCHC: 33.1 g/dL (ref 30.0–36.0)
MCV: 90.6 fl (ref 78.0–100.0)
Monocytes Absolute: 0.4 10*3/uL (ref 0.1–1.0)
Monocytes Relative: 5.7 % (ref 3.0–12.0)
Neutro Abs: 4.5 10*3/uL (ref 1.4–7.7)
Neutrophils Relative %: 68 % (ref 43.0–77.0)
Platelets: 299 10*3/uL (ref 150.0–400.0)
RBC: 4.8 Mil/uL (ref 4.22–5.81)
RDW: 13.5 % (ref 11.5–15.5)
WBC: 6.6 10*3/uL (ref 4.0–10.5)

## 2023-02-23 LAB — COMPREHENSIVE METABOLIC PANEL
ALT: 16 U/L (ref 0–53)
AST: 19 U/L (ref 0–37)
Albumin: 4.4 g/dL (ref 3.5–5.2)
Alkaline Phosphatase: 68 U/L (ref 39–117)
BUN: 20 mg/dL (ref 6–23)
CO2: 29 mEq/L (ref 19–32)
Calcium: 9.8 mg/dL (ref 8.4–10.5)
Chloride: 100 mEq/L (ref 96–112)
Creatinine, Ser: 1.14 mg/dL (ref 0.40–1.50)
GFR: 67.22 mL/min (ref >60.00)
Glucose, Bld: 98 mg/dL (ref 70–99)
Potassium: 4.4 mEq/L (ref 3.5–5.1)
Sodium: 136 mEq/L (ref 135–145)
Total Bilirubin: 1.2 mg/dL (ref 0.2–1.2)
Total Protein: 6.6 g/dL (ref 6.0–8.3)

## 2023-02-23 LAB — HEMOGLOBIN A1C: Hgb A1c MFr Bld: 6 % (ref 4.6–6.5)

## 2023-02-23 LAB — TSH: TSH: 2.19 u[IU]/mL (ref 0.35–5.50)

## 2023-02-23 LAB — PSA, MEDICARE: PSA: 2.9 ng/ml (ref 0.10–4.00)

## 2023-03-01 ENCOUNTER — Ambulatory Visit (INDEPENDENT_AMBULATORY_CARE_PROVIDER_SITE_OTHER): Payer: Medicare Other | Admitting: Family Medicine

## 2023-03-01 ENCOUNTER — Encounter: Payer: Self-pay | Admitting: Family Medicine

## 2023-03-01 VITALS — BP 138/72 | HR 86 | Temp 97.7°F | Ht 68.5 in | Wt 181.5 lb

## 2023-03-01 DIAGNOSIS — Z23 Encounter for immunization: Secondary | ICD-10-CM | POA: Diagnosis not present

## 2023-03-01 DIAGNOSIS — E7800 Pure hypercholesterolemia, unspecified: Secondary | ICD-10-CM

## 2023-03-01 DIAGNOSIS — N400 Enlarged prostate without lower urinary tract symptoms: Secondary | ICD-10-CM | POA: Insufficient documentation

## 2023-03-01 DIAGNOSIS — Z1211 Encounter for screening for malignant neoplasm of colon: Secondary | ICD-10-CM | POA: Diagnosis not present

## 2023-03-01 DIAGNOSIS — T471X5A Adverse effect of other antacids and anti-gastric-secretion drugs, initial encounter: Secondary | ICD-10-CM

## 2023-03-01 DIAGNOSIS — Z125 Encounter for screening for malignant neoplasm of prostate: Secondary | ICD-10-CM

## 2023-03-01 DIAGNOSIS — N401 Enlarged prostate with lower urinary tract symptoms: Secondary | ICD-10-CM

## 2023-03-01 DIAGNOSIS — Z0000 Encounter for general adult medical examination without abnormal findings: Secondary | ICD-10-CM

## 2023-03-01 DIAGNOSIS — R7303 Prediabetes: Secondary | ICD-10-CM

## 2023-03-01 NOTE — Assessment & Plan Note (Signed)
Lab Results  Component Value Date   HGBA1C 6.0 02/23/2023   disc imp of low glycemic diet and wt loss to prevent DM2

## 2023-03-01 NOTE — Assessment & Plan Note (Signed)
Back on omeprazole Will watch renal fuxn and vitamin levels in the future

## 2023-03-01 NOTE — Assessment & Plan Note (Signed)
With LUTS Sees urology Overall stable Psa also stable   History urge incontinence in past/not present now

## 2023-03-01 NOTE — Patient Instructions (Addendum)
If you are interested in the new shingles vaccine (Shingrix) - call your local pharmacy to check on coverage and availability  If affordable, get on a wait list at your pharmacy to get the vaccine.  I recommend a flu shot in the fall   Prevnar 20 vaccine today (pneumonia vaccine)   Keep exercising   No change in medicine

## 2023-03-01 NOTE — Assessment & Plan Note (Signed)
Lab Results  Component Value Date   PSA 2.90 02/23/2023   PSA 3.03 11/30/2021   PSA 1.14 01/28/2020    Reviewed urology note from the fall  BPH is stable

## 2023-03-01 NOTE — Assessment & Plan Note (Signed)
Colonoscopy utd 05/2014

## 2023-03-01 NOTE — Assessment & Plan Note (Signed)
Reviewed health habits including diet and exercise and skin cancer prevention Reviewed appropriate screening tests for age  Also reviewed health mt list, fam hx and immunization status , as well as social and family history   See HPI Labs reviewed and ordered Psa is stable Prevnar 20 vaccine given  Colonoscopy utd 05/2014  Declines shingrix / may consider later  Discussed supplements and exercise for bone health {PHQ score of 0)

## 2023-03-01 NOTE — Assessment & Plan Note (Signed)
Disc goals for lipids and reasons to control them Rev last labs with pt Rev low sat fat diet in detail Taking crestor 10 mg daily and fish oil LDL under 100

## 2023-03-01 NOTE — Progress Notes (Signed)
Subjective:    Patient ID: Richard Yates, male    DOB: [birth year], 67 y.o.   MRN: [id number]  HPI  Here for health maintenance exam and to review chronic medical problems   Wt Readings from Last 3 Encounters:  03/01/23 181 lb 8 oz (82.3 kg)  12/07/21 184 lb (83.5 kg)  08/14/21 175 lb (79.4 kg)   27.20 kg/m  Vitals:   03/01/23 0857 03/01/23 0924  BP: (!) 152/80 138/72  Pulse: 86   Temp: 97.7 F (36.5 C)   SpO2: 97%     Immunization History  Administered Date(s) Administered   Hepatitis A, Adult 02/13/2015   Influenza Split 06/07/2012   Influenza, Seasonal, Injecte, Preservative Fre 07/17/2015   Influenza,inj,Quad PF,6+ Mos 07/10/2013, 08/20/2016   PFIZER(Purple Top)SARS-COV-2 Vaccination 10/29/2019, 11/20/2019   PNEUMOCOCCAL CONJUGATE-20 03/01/2023   Td 05/16/2005   Tdap 02/13/2015   Typhoid Live 11/21/2014   Yellow Fever 11/21/2014    Health Maintenance Due  Topic Date Due   Medicare Annual Wellness (AWV)  Never done   Retired for a year  Has a new grandchild   Trying to take care of himself  A lot of work around house Started riding his bike again- enjoying that a lot (just under an hour)    Prostate health Has BPH with LUTS This has improved recently  Some frequency after am coffee/ but otherwise better  Often no nocturia  Last saw urology in November    Lab Results  Component Value Date   PSA 2.90 02/23/2023   PSA 3.03 11/30/2021   PSA 1.14 01/28/2020    Colon cancer screening - colonoscopy was 05/2014   Pneumonia vaccine -wants today     Declines shingrix - considering this   Prediabetes Lab Results  Component Value Date   HGBA1C 6.0 02/23/2023     Bone health  Falls-none Fractures-none  Supplements - takes mvi and sometimes he adds vit D     (does take some biotin)  Exercise - biking a lot    Mood    03/01/2023    9:43 AM 12/07/2021    8:30 AM 02/01/2020   12:06 PM 02/01/2020   12:05 PM  Depression screen PHQ 2/9   Decreased Interest 0 0 0 0  Down, Depressed, Hopeless 0 0 0 0  PHQ - 2 Score 0 0 0 0  Altered sleeping 1  0   Tired, decreased energy 0  1   Change in appetite 1  1   Feeling bad or failure about yourself  0  0   Trouble concentrating 0  0   Moving slowly or fidgety/restless 0  0   Suicidal thoughts 0  0   PHQ-9 Score 2  2   Difficult doing work/chores Not difficult at all       GERD Taking omeprazole 20 mg daily from GI Tried H2 blocker- did not work as well  Does not always watch diet    Hyperlipidemia Lab Results  Component Value Date   CHOL 158 02/23/2023   CHOL 145 06/24/2022   CHOL 162 11/30/2021   Lab Results  Component Value Date   HDL 40.00 02/23/2023   HDL 46.60 06/24/2022   HDL 40.80 11/30/2021   Lab Results  Component Value Date   LDLCALC 96 02/23/2023   LDLCALC 81 06/24/2022   LDLCALC 102 (H) 11/30/2021   Lab Results  Component Value Date   TRIG 114.0 02/23/2023   TRIG 87.0 06/24/2022  TRIG 100.0 11/30/2021   Lab Results  Component Value Date   CHOLHDL 4 02/23/2023   CHOLHDL 3 06/24/2022   CHOLHDL 4 11/30/2021   Lab Results  Component Value Date   LDLDIRECT 162.2 12/13/2012   LDLDIRECT 150.7 09/08/2012   LDLDIRECT 167.9 05/30/2012   Taking crestor 10 mg daily  Also fish oil    Has area under tongue that is getting bigger   Lab Results  Component Value Date   WBC 6.6 02/23/2023   HGB 14.4 02/23/2023   HCT 43.4 02/23/2023   MCV 90.6 02/23/2023   PLT 299.0 02/23/2023   Lab Results  Component Value Date   TSH 2.19 02/23/2023   . Lab Results  Component Value Date   NA 136 02/23/2023   K 4.4 02/23/2023   CO2 29 02/23/2023   GLUCOSE 98 02/23/2023   BUN 20 02/23/2023   CREATININE 1.14 02/23/2023   CALCIUM 9.8 02/23/2023   GFR 67.22 02/23/2023   GFRNONAA 67.45 07/23/2009    Lab Results  Component Value Date   ALT 16 02/23/2023   AST 19 02/23/2023   ALKPHOS 68 02/23/2023   BILITOT 1.2 02/23/2023      Patient  Active Problem List   Diagnosis Date Noted   Adverse effect of proton pump inhibitor 03/01/2023   BPH (benign prostatic hyperplasia) 03/01/2023   Right elbow pain 12/07/2021   Allergic conjunctivitis 12/30/2020   Hyperkalemia 02/01/2020   Urge incontinence 08/20/2016   Neck pain, chronic 05/16/2015   Prediabetes 04/17/2014   Colon cancer screening 11/07/2013   Frequent urination 07/10/2013   Prostate cancer screening 07/10/2013   Routine general medical examination at a health care facility 05/29/2012   ALLERGIC RHINITIS 11/13/2008   DERMATITIS, SEBORRHEIC 08/29/2008   HEMORRHOIDS 06/07/2008   Hyperlipidemia 02/02/2007   GERD 02/02/2007   Past Medical History:  Diagnosis Date   Allergic rhinitis, cause unspecified    DDD (degenerative disc disease) 10/2000   Esophageal reflux    Pure hypercholesterolemia    Rash and other nonspecific skin eruption    Routine general medical examination at a health care facility    Seborrheic dermatitis, unspecified    Special screening for malignant neoplasm of prostate    Unspecified gastritis and gastroduodenitis without mention of hemorrhage    Unspecified hemorrhoids without mention of complication    Past Surgical History:  Procedure Laterality Date   COLONOSCOPY  10/2003   ESOPHAGOGASTRODUODENOSCOPY  10/2003   HH-had peptic bleeding, gastritis, neg H pylori   MRI     Social History   Tobacco Use   Smoking status: Never   Smokeless tobacco: Never  Substance Use Topics   Alcohol use: No    Alcohol/week: 0.0 standard drinks of alcohol   Drug use: No   Family History  Problem Relation Age of Onset   Diabetes Father        borderline; elevated PSA   Hypertension Mother    Hypertension Other        GM   Prostate cancer Paternal Grandfather    Stroke Maternal Grandfather    Parkinson's disease Maternal Aunt    Healthy Sister    Healthy Daughter        x 3   Allergies  Allergen Reactions   Penicillins Hives    REACTION:  rash   Atorvastatin     REACTION: muscle aches/headache   Current Outpatient Medications on File Prior to Visit  Medication Sig Dispense Refill   ASPIRIN 81 PO  Take 81 mg by mouth 1 day or 1 dose.      Cetirizine HCl (ZYRTEC ALLERGY) 10 MG CAPS 1 day or 1 dose.     Multiple Vitamin (MULTIVITAMIN) tablet Take 1 tablet by mouth daily.     Omega-3 Fatty Acids (FISH OIL) 1200 MG CAPS Take 1 capsule by mouth daily.     omeprazole (PRILOSEC) 20 MG capsule Take 20 mg by mouth daily.     rosuvastatin (CRESTOR) 10 MG tablet Take 10 mg by mouth daily.     No current facility-administered medications on file prior to visit.    Review of Systems  Constitutional:  Negative for activity change, appetite change, fatigue, fever and unexpected weight change.  HENT:  Negative for congestion, rhinorrhea, sore throat and trouble swallowing.   Eyes:  Negative for pain, redness, itching and visual disturbance.  Respiratory:  Negative for cough, chest tightness, shortness of breath and wheezing.   Cardiovascular:  Negative for chest pain and palpitations.  Gastrointestinal:  Negative for abdominal pain, blood in stool, constipation, diarrhea and nausea.  Endocrine: Negative for cold intolerance, heat intolerance, polydipsia and polyuria.  Genitourinary:  Negative for difficulty urinating, dysuria, frequency and urgency.  Musculoskeletal:  Negative for arthralgias, joint swelling and myalgias.  Skin:  Negative for pallor and rash.  Neurological:  Negative for dizziness, tremors, weakness, numbness and headaches.  Hematological:  Negative for adenopathy. Does not bruise/bleed easily.  Psychiatric/Behavioral:  Negative for decreased concentration and dysphoric mood. The patient is not nervous/anxious.        Objective:   Physical Exam Constitutional:      General: He is not in acute distress.    Appearance: Normal appearance. He is well-developed and normal weight. He is not ill-appearing or diaphoretic.   HENT:     Head: Normocephalic and atraumatic.     Right Ear: Tympanic membrane, ear canal and external ear normal.     Left Ear: Tympanic membrane, ear canal and external ear normal.     Nose: Nose normal. No congestion.     Mouth/Throat:     Mouth: Mucous membranes are moist.     Pharynx: Oropharynx is clear. No posterior oropharyngeal erythema.  Eyes:     General: No scleral icterus.       Right eye: No discharge.        Left eye: No discharge.     Conjunctiva/sclera: Conjunctivae normal.     Pupils: Pupils are equal, round, and reactive to light.  Neck:     Thyroid: No thyromegaly.     Vascular: No carotid bruit or JVD.  Cardiovascular:     Rate and Rhythm: Normal rate and regular rhythm.     Pulses: Normal pulses.     Heart sounds: Normal heart sounds.     No gallop.  Pulmonary:     Effort: Pulmonary effort is normal. No respiratory distress.     Breath sounds: Normal breath sounds. No wheezing or rales.     Comments: Good air exch Chest:     Chest wall: No tenderness.  Abdominal:     General: Bowel sounds are normal. There is no distension or abdominal bruit.     Palpations: Abdomen is soft. There is no mass.     Tenderness: There is no abdominal tenderness.     Hernia: No hernia is present.  Musculoskeletal:        General: No tenderness.     Cervical back: Normal range of motion and neck  supple. No rigidity. No muscular tenderness.     Right lower leg: No edema.     Left lower leg: No edema.  Lymphadenopathy:     Cervical: No cervical adenopathy.  Skin:    General: Skin is warm and dry.     Coloration: Skin is not pale.     Findings: No erythema or rash.     Comments: Solar lentigines diffusely   Neurological:     Mental Status: He is alert.     Cranial Nerves: No cranial nerve deficit.     Motor: No abnormal muscle tone.     Coordination: Coordination normal.     Gait: Gait normal.     Deep Tendon Reflexes: Reflexes are normal and symmetric. Reflexes  normal.  Psychiatric:        Mood and Affect: Mood normal.        Cognition and Memory: Cognition and memory normal.           Assessment & Plan:   Problem List Items Addressed This Visit       Genitourinary   BPH (benign prostatic hyperplasia)    With LUTS Sees urology Overall stable Psa also stable   History urge incontinence in past/not present now         Other   Routine general medical examination at a health care facility - Primary    Reviewed health habits including diet and exercise and skin cancer prevention Reviewed appropriate screening tests for age  Also reviewed health mt list, fam hx and immunization status , as well as social and family history   See HPI Labs reviewed and ordered Psa is stable Prevnar 20 vaccine given  Colonoscopy utd 05/2014  Declines shingrix / may consider later  Discussed supplements and exercise for bone health {PHQ score of 0)       Prostate cancer screening    Lab Results  Component Value Date   PSA 2.90 02/23/2023   PSA 3.03 11/30/2021   PSA 1.14 01/28/2020    Reviewed urology note from the fall  BPH is stable      Prediabetes    Lab Results  Component Value Date   HGBA1C 6.0 02/23/2023   disc imp of low glycemic diet and wt loss to prevent DM2       Hyperlipidemia    Disc goals for lipids and reasons to control them Rev last labs with pt Rev low sat fat diet in detail Taking crestor 10 mg daily and fish oil LDL under 100      Colon cancer screening    Colonoscopy utd 05/2014       Adverse effect of proton pump inhibitor    Back on omeprazole Will watch renal fuxn and vitamin levels in the future       Other Visit Diagnoses     Need for pneumococcal 20-valent conjugate vaccination       Relevant Orders   Pneumococcal conjugate vaccine 20-valent (Prevnar 20) (Completed)

## 2023-05-13 ENCOUNTER — Ambulatory Visit: Payer: Federal, State, Local not specified - PPO | Admitting: Family Medicine

## 2023-05-13 DIAGNOSIS — M79641 Pain in right hand: Secondary | ICD-10-CM | POA: Diagnosis not present

## 2023-05-13 DIAGNOSIS — M25641 Stiffness of right hand, not elsewhere classified: Secondary | ICD-10-CM | POA: Diagnosis not present

## 2023-05-20 DIAGNOSIS — M79641 Pain in right hand: Secondary | ICD-10-CM | POA: Diagnosis not present

## 2023-06-20 DIAGNOSIS — H43821 Vitreomacular adhesion, right eye: Secondary | ICD-10-CM | POA: Diagnosis not present

## 2023-06-20 DIAGNOSIS — H43812 Vitreous degeneration, left eye: Secondary | ICD-10-CM | POA: Diagnosis not present

## 2023-06-20 DIAGNOSIS — H2513 Age-related nuclear cataract, bilateral: Secondary | ICD-10-CM | POA: Diagnosis not present

## 2023-06-20 DIAGNOSIS — H35372 Puckering of macula, left eye: Secondary | ICD-10-CM | POA: Diagnosis not present

## 2023-06-21 ENCOUNTER — Ambulatory Visit (INDEPENDENT_AMBULATORY_CARE_PROVIDER_SITE_OTHER): Payer: Medicare Other

## 2023-06-21 DIAGNOSIS — Z23 Encounter for immunization: Secondary | ICD-10-CM | POA: Diagnosis not present

## 2023-11-03 ENCOUNTER — Encounter: Payer: Self-pay | Admitting: Family Medicine

## 2023-11-03 ENCOUNTER — Ambulatory Visit (INDEPENDENT_AMBULATORY_CARE_PROVIDER_SITE_OTHER): Admitting: Family Medicine

## 2023-11-03 ENCOUNTER — Telehealth: Payer: Self-pay | Admitting: *Deleted

## 2023-11-03 VITALS — BP 156/80 | HR 79 | Temp 98.0°F | Ht 68.5 in | Wt 185.2 lb

## 2023-11-03 DIAGNOSIS — R7989 Other specified abnormal findings of blood chemistry: Secondary | ICD-10-CM | POA: Diagnosis not present

## 2023-11-03 DIAGNOSIS — R1013 Epigastric pain: Secondary | ICD-10-CM | POA: Diagnosis not present

## 2023-11-03 DIAGNOSIS — R197 Diarrhea, unspecified: Secondary | ICD-10-CM

## 2023-11-03 DIAGNOSIS — K219 Gastro-esophageal reflux disease without esophagitis: Secondary | ICD-10-CM

## 2023-11-03 DIAGNOSIS — R1110 Vomiting, unspecified: Secondary | ICD-10-CM | POA: Insufficient documentation

## 2023-11-03 LAB — CBC WITH DIFFERENTIAL/PLATELET
Basophils Absolute: 0 10*3/uL (ref 0.0–0.1)
Basophils Relative: 0.4 % (ref 0.0–3.0)
Eosinophils Absolute: 0.2 10*3/uL (ref 0.0–0.7)
Eosinophils Relative: 4.1 % (ref 0.0–5.0)
HCT: 41.9 % (ref 39.0–52.0)
Hemoglobin: 14.1 g/dL (ref 13.0–17.0)
Lymphocytes Relative: 21 % (ref 12.0–46.0)
Lymphs Abs: 1 10*3/uL (ref 0.7–4.0)
MCHC: 33.8 g/dL (ref 30.0–36.0)
MCV: 91.1 fl (ref 78.0–100.0)
Monocytes Absolute: 0.3 10*3/uL (ref 0.1–1.0)
Monocytes Relative: 5.8 % (ref 3.0–12.0)
Neutro Abs: 3.2 10*3/uL (ref 1.4–7.7)
Neutrophils Relative %: 68.7 % (ref 43.0–77.0)
Platelets: 283 10*3/uL (ref 150.0–400.0)
RBC: 4.59 Mil/uL (ref 4.22–5.81)
RDW: 13.1 % (ref 11.5–15.5)
WBC: 4.6 10*3/uL (ref 4.0–10.5)

## 2023-11-03 LAB — COMPREHENSIVE METABOLIC PANEL
ALT: 212 U/L — ABNORMAL HIGH (ref 0–53)
AST: 160 U/L — ABNORMAL HIGH (ref 0–37)
Albumin: 4.3 g/dL (ref 3.5–5.2)
Alkaline Phosphatase: 198 U/L — ABNORMAL HIGH (ref 39–117)
BUN: 10 mg/dL (ref 6–23)
CO2: 28 meq/L (ref 19–32)
Calcium: 9.7 mg/dL (ref 8.4–10.5)
Chloride: 103 meq/L (ref 96–112)
Creatinine, Ser: 1.09 mg/dL (ref 0.40–1.50)
GFR: 70.6 mL/min (ref >60.00)
Glucose, Bld: 111 mg/dL — ABNORMAL HIGH (ref 70–99)
Potassium: 4 meq/L (ref 3.5–5.1)
Sodium: 138 meq/L (ref 135–145)
Total Bilirubin: 1.3 mg/dL — ABNORMAL HIGH (ref 0.2–1.2)
Total Protein: 6.9 g/dL (ref 6.0–8.3)

## 2023-11-03 LAB — LIPASE: Lipase: 27 U/L (ref 11.0–59.0)

## 2023-11-03 NOTE — Addendum Note (Signed)
 Addended by: Kerby Nora E on: 11/03/2023 03:09 PM   Modules accepted: Orders

## 2023-11-03 NOTE — Telephone Encounter (Signed)
 Copied from CRM [id number]. Topic: General - Other >> Nov 03, 2023  1:06 PM Jon Gills C wrote: Reason for CRM: Patient wife called in regarding a missed call. Would like for whoever to call him to give them a callback

## 2023-11-03 NOTE — Telephone Encounter (Signed)
 Message has already been sent to Dr. Ermalene Searing

## 2023-11-03 NOTE — Progress Notes (Signed)
 Patient ID: Richard Yates, male    DOB: [birth year], 68 y.o.   MRN: [id number]  This visit was conducted in person.  BP (!) 156/80   Pulse 79   Temp 98 F (36.7 C) (Oral)   Ht 5' 8.5" (1.74 m)   Wt 185 lb 4 oz (84 kg)   SpO2 98%   BMI 27.76 kg/m    CC:  Chief Complaint  Patient presents with   Emesis    C/o vomiting, diarrhea, reflux and abd pain in epigastric region. Sxs started 10/29/23.     Subjective:   HPI: Richard Yates is a 68 y.o. male  patient of Dr. Royden Purl  with history of reflux presenting on 11/03/2023 for Emesis (C/o vomiting, diarrhea, reflux and abd pain in epigastric region. Sxs started 10/29/23. )  Few days prior did a lot of heavy lifting of logs.  Had more fried food than usual, ate at [employer].  Missed 3 days of reflux medication, omeprazole 20 mg daily   He reports new onset    Epigastric pain, bloating... woke him from sleep 3/16.  Started with dry heaves, nausea and   No blood.  Started diarrhea.  Both emesis and diarrhea resolved by  next day.  No fever. No sick contacts.  Continued upper abdominal  achiness, dull pain, gradually improving  Nml UOP and good  po intake ( blander foods), well hydrated 48 oz or so per day.  Relevant past medical, surgical, family and social history reviewed and updated as indicated. Interim medical history since our last visit reviewed. Allergies and medications reviewed and updated. Outpatient Medications Prior to Visit  Medication Sig Dispense Refill   Cetirizine HCl (ZYRTEC ALLERGY) 10 MG CAPS 1 day or 1 dose.     Multiple Vitamin (MULTIVITAMIN) tablet Take 1 tablet by mouth daily.     Omega-3 Fatty Acids (FISH OIL) 1200 MG CAPS Take 1 capsule by mouth daily.     omeprazole (PRILOSEC) 20 MG capsule Take 20 mg by mouth daily.     rosuvastatin (CRESTOR) 10 MG tablet Take 10 mg by mouth daily.     ASPIRIN 81 PO Take 81 mg by mouth 1 day or 1 dose.      No facility-administered medications prior to visit.      Per HPI unless specifically indicated in ROS section below Review of Systems  Constitutional:  Negative for fatigue and fever.  HENT:  Negative for ear pain.   Eyes:  Negative for pain.  Respiratory:  Negative for cough and shortness of breath.   Cardiovascular:  Negative for chest pain, palpitations and leg swelling.  Gastrointestinal:  Positive for abdominal pain, diarrhea, nausea and vomiting.  Genitourinary:  Negative for dysuria.  Musculoskeletal:  Negative for arthralgias.  Neurological:  Negative for syncope, light-headedness and headaches.  Psychiatric/Behavioral:  Negative for dysphoric mood.    Objective:  BP (!) 156/80   Pulse 79   Temp 98 F (36.7 C) (Oral)   Ht 5' 8.5" (1.74 m)   Wt 185 lb 4 oz (84 kg)   SpO2 98%   BMI 27.76 kg/m   Wt Readings from Last 3 Encounters:  11/03/23 185 lb 4 oz (84 kg)  03/01/23 181 lb 8 oz (82.3 kg)  12/07/21 184 lb (83.5 kg)      Physical Exam Constitutional:      General: He is not in acute distress.    Appearance: Normal appearance. He is well-developed. He is  not ill-appearing, toxic-appearing or diaphoretic.  HENT:     Head: Normocephalic.     Right Ear: Hearing normal.     Left Ear: Hearing normal.     Nose: Nose normal.  Neck:     Thyroid: No thyroid mass or thyromegaly.     Vascular: No carotid bruit.     Trachea: Trachea normal.  Cardiovascular:     Rate and Rhythm: Normal rate and regular rhythm.     Pulses: Normal pulses.     Heart sounds: Heart sounds not distant. No murmur heard.    No friction rub. No gallop.     Comments: No peripheral edema Pulmonary:     Effort: Pulmonary effort is normal. No respiratory distress.     Breath sounds: Normal breath sounds.  Abdominal:     General: Abdomen is flat. Bowel sounds are normal.     Palpations: Abdomen is soft.     Tenderness: There is abdominal tenderness in the epigastric area. There is no right CVA tenderness, left CVA tenderness, guarding or rebound.   Skin:    General: Skin is warm and dry.     Findings: No rash.  Neurological:     Mental Status: He is alert.  Psychiatric:        Speech: Speech normal.        Behavior: Behavior normal.        Thought Content: Thought content normal.       Results for orders placed or performed in visit on 02/23/23  Hemoglobin A1c   Collection Time: 02/23/23  8:44 AM  Result Value Ref Range   Hgb A1c MFr Bld 6.0 4.6 - 6.5 %  CBC with Differential/Platelet   Collection Time: 02/23/23  8:44 AM  Result Value Ref Range   WBC 6.6 4.0 - 10.5 K/uL   RBC 4.80 4.22 - 5.81 Mil/uL   Hemoglobin 14.4 13.0 - 17.0 g/dL   HCT 95.2 84.1 - 32.4 %   MCV 90.6 78.0 - 100.0 fl   MCHC 33.1 30.0 - 36.0 g/dL   RDW 40.1 02.7 - 25.3 %   Platelets 299.0 150.0 - 400.0 K/uL   Neutrophils Relative % 68.0 43.0 - 77.0 %   Lymphocytes Relative 22.4 12.0 - 46.0 %   Monocytes Relative 5.7 3.0 - 12.0 %   Eosinophils Relative 3.2 0.0 - 5.0 %   Basophils Relative 0.7 0.0 - 3.0 %   Neutro Abs 4.5 1.4 - 7.7 K/uL   Lymphs Abs 1.5 0.7 - 4.0 K/uL   Monocytes Absolute 0.4 0.1 - 1.0 K/uL   Eosinophils Absolute 0.2 0.0 - 0.7 K/uL   Basophils Absolute 0.0 0.0 - 0.1 K/uL  Comprehensive metabolic panel   Collection Time: 02/23/23  8:44 AM  Result Value Ref Range   Sodium 136 135 - 145 mEq/L   Potassium 4.4 3.5 - 5.1 mEq/L   Chloride 100 96 - 112 mEq/L   CO2 29 19 - 32 mEq/L   Glucose, Bld 98 70 - 99 mg/dL   BUN 20 6 - 23 mg/dL   Creatinine, Ser 6.64 0.40 - 1.50 mg/dL   Total Bilirubin 1.2 0.2 - 1.2 mg/dL   Alkaline Phosphatase 68 39 - 117 U/L   AST 19 0 - 37 U/L   ALT 16 0 - 53 U/L   Total Protein 6.6 6.0 - 8.3 g/dL   Albumin 4.4 3.5 - 5.2 g/dL   GFR 40.34 >74.25 mL/min   Calcium 9.8 8.4 - 10.5  mg/dL  Lipid panel   Collection Time: 02/23/23  8:44 AM  Result Value Ref Range   Cholesterol 158 0 - 200 mg/dL   Triglycerides 161.0 0.0 - 149.0 mg/dL   HDL 96.04 >54.09 mg/dL   VLDL 81.1 0.0 - 91.4 mg/dL   LDL Cholesterol  96 0 - 99 mg/dL   Total CHOL/HDL Ratio 4    NonHDL 118.47   TSH   Collection Time: 02/23/23  8:44 AM  Result Value Ref Range   TSH 2.19 0.35 - 5.50 uIU/mL  PSA, Medicare   Collection Time: 02/23/23  8:44 AM  Result Value Ref Range   PSA 2.90 0.10 - 4.00 ng/ml    Assessment and Plan  Epigastric pain Assessment & Plan: Acute, differential includes viral gastroenteritis, reflux off medication, gastritis, peptic ulcer disease and gallbladder disease.  Will evaluate with labs including CBC to check white blood cell count for infection, liver function tests and lipase to rule out pancreatitis. Patient is well-hydrated in no distress.  There are no red flags or indication for admission. If soreness is not improving we can move forward with ultrasound of right upper quadrant to evaluate gallbladder. He will continue omeprazole 20 mg daily but will add Pepcid AC over-the-counter twice a day to block acid.  Avoid acidic reflux trigger foods, greasy foods.  Continue to keep up with hydration.  Return and ER precautions provided.  If fever or severe pain he will go to the emergency room.  Patient agreeable.  Orders: -     Comprehensive metabolic panel -     CBC with Differential/Platelet -     Lipase  Vomiting and diarrhea  Gastroesophageal reflux disease, unspecified whether esophagitis present Assessment & Plan:  Chronic, now back on omeprazole 20 mg daily.     Return if symptoms worsen or fail to improve.   Kerby Nora, MD

## 2023-11-03 NOTE — Assessment & Plan Note (Signed)
 Acute, differential includes viral gastroenteritis, reflux off medication, gastritis, peptic ulcer disease and gallbladder disease.  Will evaluate with labs including CBC to check white blood cell count for infection, liver function tests and lipase to rule out pancreatitis. Patient is well-hydrated in no distress.  There are no red flags or indication for admission. If soreness is not improving we can move forward with ultrasound of right upper quadrant to evaluate gallbladder. He will continue omeprazole 20 mg daily but will add Pepcid AC over-the-counter twice a day to block acid.  Avoid acidic reflux trigger foods, greasy foods.  Continue to keep up with hydration.  Return and ER precautions provided.  If fever or severe pain he will go to the emergency room.  Patient agreeable.

## 2023-11-03 NOTE — Telephone Encounter (Signed)
 Mr. Richard Yates and Dr. Ermalene Searing has communicated via MyChart.

## 2023-11-03 NOTE — Telephone Encounter (Signed)
 Patient has been set up with [place] [facility].  Pt given ER precautions at [facility] and on Mychart.

## 2023-11-03 NOTE — Telephone Encounter (Signed)
 Pt called back returning Dr. Daphine Deutscher call. Call back # [phone]

## 2023-11-03 NOTE — Assessment & Plan Note (Signed)
 Chronic, now back on omeprazole 20 mg daily.

## 2023-11-03 NOTE — Telephone Encounter (Signed)
 Will route to [place] pool since she eval pt

## 2023-11-03 NOTE — Telephone Encounter (Signed)
 Copied from CRM [id number]. Topic: Clinical - Lab/Test Results >> Nov 03, 2023  3:28 PM Chantha C wrote: Reason for CRM: Patient is returning the office/Dr. Bedsole's voicemail advising patient to call back to speak with CMA on test results. Per CAL, they're unavailabe at this time. Please call back at [phone].

## 2023-11-04 ENCOUNTER — Ambulatory Visit
Admission: RE | Admit: 2023-11-04 | Discharge: 2023-11-04 | Disposition: A | Discharge status: Home / Self Care | Source: Ambulatory Visit | Attending: Family Medicine | Admitting: Family Medicine

## 2023-11-04 ENCOUNTER — Other Ambulatory Visit: Payer: Self-pay | Admitting: Family Medicine

## 2023-11-04 DIAGNOSIS — R1013 Epigastric pain: Secondary | ICD-10-CM

## 2023-11-04 DIAGNOSIS — R7989 Other specified abnormal findings of blood chemistry: Secondary | ICD-10-CM

## 2023-11-04 DIAGNOSIS — K760 Fatty (change of) liver, not elsewhere classified: Secondary | ICD-10-CM | POA: Diagnosis not present

## 2023-11-07 ENCOUNTER — Other Ambulatory Visit (INDEPENDENT_AMBULATORY_CARE_PROVIDER_SITE_OTHER)

## 2023-11-07 DIAGNOSIS — R7989 Other specified abnormal findings of blood chemistry: Secondary | ICD-10-CM | POA: Diagnosis not present

## 2023-11-07 LAB — HEPATIC FUNCTION PANEL
ALT: 151 U/L — ABNORMAL HIGH (ref 0–53)
AST: 61 U/L — ABNORMAL HIGH (ref 0–37)
Albumin: 4.5 g/dL (ref 3.5–5.2)
Alkaline Phosphatase: 237 U/L — ABNORMAL HIGH (ref 39–117)
Bilirubin, Direct: 0.2 mg/dL (ref 0.0–0.3)
Total Bilirubin: 1 mg/dL (ref 0.2–1.2)
Total Protein: 6.8 g/dL (ref 6.0–8.3)

## 2023-11-07 LAB — GAMMA GT: GGT: 703 U/L — ABNORMAL HIGH (ref 7–51)

## 2023-11-08 ENCOUNTER — Other Ambulatory Visit: Payer: Self-pay | Admitting: Family Medicine

## 2023-11-08 ENCOUNTER — Encounter: Payer: Self-pay | Admitting: Family Medicine

## 2023-11-08 ENCOUNTER — Telehealth: Payer: Self-pay | Admitting: *Deleted

## 2023-11-08 DIAGNOSIS — R1013 Epigastric pain: Secondary | ICD-10-CM

## 2023-11-08 DIAGNOSIS — R7989 Other specified abnormal findings of blood chemistry: Secondary | ICD-10-CM

## 2023-11-08 LAB — HEPATITIS PANEL, ACUTE
Hep A IgM: NONREACTIVE
Hep B C IgM: NONREACTIVE
Hepatitis B Surface Ag: NONREACTIVE
Hepatitis C Ab: NONREACTIVE

## 2023-11-08 NOTE — Telephone Encounter (Signed)
 Copied from CRM [id number]. Topic: Clinical - Lab/Test Results >> Nov 08, 2023 11:48 AM Fredrich Romans wrote: Reason for CRM: Patient husband called to ask has provider had a chance to look at his labwork fro yesterday?

## 2023-11-08 NOTE — Progress Notes (Unsigned)
 New order for stat  Abd CT placed.

## 2023-11-08 NOTE — Progress Notes (Signed)
 Mr. Sheley notified as instructed by telephone.  ER precautions given.  Patient states understanding.

## 2023-11-08 NOTE — Progress Notes (Signed)
 Will move forward with CT abd for possible common bile duct stone/dilation.  Will refer to Dr. Loreta Ave .. once CT returns.   Please let pt know that if severe abdominal pain, fever, not able to keep up liquids.Marland Kitchen go to ER.

## 2023-11-08 NOTE — Telephone Encounter (Signed)
 Thanks so much for seeing him and ordering the studies

## 2023-11-08 NOTE — Telephone Encounter (Signed)
 Will route to PCP and Dr. Ermalene Searing who ordered labs

## 2023-11-09 ENCOUNTER — Other Ambulatory Visit: Payer: Self-pay | Admitting: Family Medicine

## 2023-11-09 ENCOUNTER — Encounter: Payer: Self-pay | Admitting: *Deleted

## 2023-11-09 ENCOUNTER — Ambulatory Visit
Admission: RE | Admit: 2023-11-09 | Discharge: 2023-11-09 | Disposition: A | Discharge status: Home / Self Care | Source: Ambulatory Visit | Attending: Family Medicine | Admitting: Family Medicine

## 2023-11-09 ENCOUNTER — Encounter: Payer: Self-pay | Admitting: Family Medicine

## 2023-11-09 DIAGNOSIS — R7989 Other specified abnormal findings of blood chemistry: Secondary | ICD-10-CM

## 2023-11-09 DIAGNOSIS — R1011 Right upper quadrant pain: Secondary | ICD-10-CM | POA: Diagnosis not present

## 2023-11-09 DIAGNOSIS — R1013 Epigastric pain: Secondary | ICD-10-CM

## 2023-11-09 MED ORDER — IOPAMIDOL (ISOVUE-300) INJECTION 61%
100.0000 mL | Freq: Once | INTRAVENOUS | Status: AC | PRN
  Administered 2023-11-09: 100 mL via INTRAVENOUS

## 2023-11-09 NOTE — Progress Notes (Signed)
 Patient is scheduled for today 11/09/23 with [facility] Theotis Burrow

## 2023-11-15 DIAGNOSIS — K219 Gastro-esophageal reflux disease without esophagitis: Secondary | ICD-10-CM | POA: Diagnosis not present

## 2023-11-15 DIAGNOSIS — K5730 Diverticulosis of large intestine without perforation or abscess without bleeding: Secondary | ICD-10-CM | POA: Diagnosis not present

## 2023-11-15 DIAGNOSIS — R748 Abnormal levels of other serum enzymes: Secondary | ICD-10-CM | POA: Diagnosis not present

## 2023-11-15 DIAGNOSIS — Z1211 Encounter for screening for malignant neoplasm of colon: Secondary | ICD-10-CM | POA: Diagnosis not present

## 2024-01-19 ENCOUNTER — Ambulatory Visit (INDEPENDENT_AMBULATORY_CARE_PROVIDER_SITE_OTHER): Admitting: Family Medicine

## 2024-01-19 ENCOUNTER — Encounter: Payer: Self-pay | Admitting: Family Medicine

## 2024-01-19 VITALS — BP 140/74 | HR 75 | Temp 98.4°F | Ht 68.5 in | Wt 182.4 lb

## 2024-01-19 DIAGNOSIS — H60331 Swimmer's ear, right ear: Secondary | ICD-10-CM | POA: Diagnosis not present

## 2024-01-19 DIAGNOSIS — K5730 Diverticulosis of large intestine without perforation or abscess without bleeding: Secondary | ICD-10-CM | POA: Insufficient documentation

## 2024-01-19 DIAGNOSIS — L03116 Cellulitis of left lower limb: Secondary | ICD-10-CM | POA: Diagnosis not present

## 2024-01-19 MED ORDER — DOXYCYCLINE HYCLATE 100 MG PO TABS: 100.0000 mg | ORAL_TABLET | Freq: Two times a day (BID) | ORAL | 0 refills | Status: DC

## 2024-01-19 NOTE — Assessment & Plan Note (Signed)
 Acute, recent injury to right ear canal with Q-tip and recent swimming. Will treat with oral antibiotics given additional foot infection.  Can use ibuprofen and Motrin for pain.  Return and ER precautions provided.

## 2024-01-19 NOTE — Assessment & Plan Note (Signed)
 Acute, left foot injury.  Concern for cellulitis given spreading of redness to toes although foot is not hot.  Recommend ice, elevation and anti-inflammatory for pain.  Will treat empirically for possible cellulitis with doxycycline 100 mg p.o. twice daily x 10 days.  Return and ER precautions provided.

## 2024-01-19 NOTE — Progress Notes (Signed)
 Patient ID: Richard Yates, male    DOB: 12/04/1955, 68 y.o.   MRN: [id number]  This visit was conducted in person.  BP (!) 140/74   Pulse 75   Temp 98.4 F (36.9 C) (Temporal)   Ht 5' 8.5" (1.74 m)   Wt 182 lb 6 oz (82.7 kg)   SpO2 98%   BMI 27.33 kg/m    CC:  Chief Complaint  Patient presents with   Laceration    Top of Left Foot-Happened Monday of last week   Ear Pain    Right    Subjective:   HPI: Richard Yates is a 67 y.o. male presenting on 01/19/2024 for Laceration (Top of Left Foot-Happened Monday of last week) and Ear Pain (Right)  Presents with laceration on the top of his left foot, injured 1 and half weeks ago around 01/09/2024  He was in [place] .. scraped foot on bottom of a door.  Now in last 6 days has noted increasing redness, spreading to middle 2 toes and up foot yesterday. Did kayak in water.  Top of foot sore with walking.  Foot more swollen, better now some.  No discharge.   He also has noted right ear pain.  When cleaning with Qtip.Richard Yates scratched ear but now in last 2 days ear pain.  Occ waking up at night.   No fever.  Last 2016 and 2006 Relevant past medical, surgical, family and social history reviewed and updated as indicated. Interim medical history since our last visit reviewed. Allergies and medications reviewed and updated. Outpatient Medications Prior to Visit  Medication Sig Dispense Refill   Cetirizine HCl (ZYRTEC ALLERGY) 10 MG CAPS 1 day or 1 dose.     Multiple Vitamin (MULTIVITAMIN) tablet Take 1 tablet by mouth daily.     Omega-3 Fatty Acids (FISH OIL) 1200 MG CAPS Take 1 capsule by mouth daily.     omeprazole (PRILOSEC) 20 MG capsule Take 20 mg by mouth daily.     rosuvastatin  (CRESTOR ) 10 MG tablet Take 10 mg by mouth daily.     No facility-administered medications prior to visit.     Per HPI unless specifically indicated in ROS section below Review of Systems  Constitutional:  Negative for fatigue and fever.  HENT:   Positive for ear pain.   Eyes:  Negative for pain.  Respiratory:  Negative for cough and shortness of breath.   Cardiovascular:  Negative for chest pain, palpitations and leg swelling.  Gastrointestinal:  Negative for abdominal pain.  Genitourinary:  Negative for dysuria.  Musculoskeletal:  Negative for arthralgias.  Neurological:  Negative for syncope, light-headedness and headaches.  Psychiatric/Behavioral:  Negative for dysphoric mood.    Objective:  BP (!) 140/74   Pulse 75   Temp 98.4 F (36.9 C) (Temporal)   Ht 5' 8.5" (1.74 m)   Wt 182 lb 6 oz (82.7 kg)   SpO2 98%   BMI 27.33 kg/m   Wt Readings from Last 3 Encounters:  01/19/24 182 lb 6 oz (82.7 kg)  11/03/23 185 lb 4 oz (84 kg)  03/01/23 181 lb 8 oz (82.3 kg)      Physical Exam Vitals reviewed.  Constitutional:      Appearance: He is well-developed.  HENT:     Head: Normocephalic.     Right Ear: Hearing normal. Swelling and tenderness present. No middle ear effusion. Tympanic membrane is not erythematous.     Left Ear: Hearing normal. No swelling or tenderness.  No middle ear effusion. Tympanic membrane is not erythematous.     Nose: Nose normal.  Neck:     Thyroid : No thyroid  mass or thyromegaly.     Vascular: No carotid bruit.     Trachea: Trachea normal.  Cardiovascular:     Rate and Rhythm: Normal rate and regular rhythm.     Pulses: Normal pulses.     Heart sounds: Heart sounds not distant. No murmur heard.    No friction rub. No gallop.     Comments: No peripheral edema Pulmonary:     Effort: Pulmonary effort is normal. No respiratory distress.     Breath sounds: Normal breath sounds.  Skin:    General: Skin is warm and dry.     Findings: No rash.          Comments: Left foot redness, no warmth  Psychiatric:        Speech: Speech normal.        Behavior: Behavior normal.        Thought Content: Thought content normal.       Results for orders placed or performed in visit on 11/07/23  Gamma  GT   Collection Time: 11/07/23  9:24 AM  Result Value Ref Range   GGT 703 (H) 7 - 51 U/L  Hepatitis panel, acute   Collection Time: 11/07/23  9:24 AM  Result Value Ref Range   Hep A IgM NON-REACTIVE NON-REACTIVE   Hepatitis B Surface Ag NON-REACTIVE NON-REACTIVE   Hep B C IgM NON-REACTIVE NON-REACTIVE   Hepatitis C Ab NON-REACTIVE NON-REACTIVE  Hepatic Function Panel   Collection Time: 11/07/23  9:24 AM  Result Value Ref Range   Total Bilirubin 1.0 0.2 - 1.2 mg/dL   Bilirubin, Direct 0.2 0.0 - 0.3 mg/dL   Alkaline Phosphatase 237 (H) 39 - 117 U/L   AST 61 (H) 0 - 37 U/L   ALT 151 (H) 0 - 53 U/L   Total Protein 6.8 6.0 - 8.3 g/dL   Albumin 4.5 3.5 - 5.2 g/dL    Assessment and Plan  Cellulitis of left lower extremity Assessment & Plan: Acute, left foot injury.  Concern for cellulitis given spreading of redness to toes although foot is not hot.  Recommend ice, elevation and anti-inflammatory for pain.  Will treat empirically for possible cellulitis with doxycycline 100 mg p.o. twice daily x 10 days.  Return and ER precautions provided.   Acute swimmer's ear of right side Assessment & Plan: Acute, recent injury to right ear canal with Q-tip and recent swimming. Will treat with oral antibiotics given additional foot infection.  Can use ibuprofen and Motrin for pain.  Return and ER precautions provided.   Other orders -     Doxycycline Hyclate; Take 1 tablet (100 mg total) by mouth 2 (two) times daily.  Dispense: 20 tablet; Refill: 0    No follow-ups on file.   Richard Lolling, MD

## 2024-02-09 DIAGNOSIS — L814 Other melanin hyperpigmentation: Secondary | ICD-10-CM | POA: Diagnosis not present

## 2024-02-09 DIAGNOSIS — D2261 Melanocytic nevi of right upper limb, including shoulder: Secondary | ICD-10-CM | POA: Diagnosis not present

## 2024-02-09 DIAGNOSIS — L918 Other hypertrophic disorders of the skin: Secondary | ICD-10-CM | POA: Diagnosis not present

## 2024-02-09 DIAGNOSIS — L821 Other seborrheic keratosis: Secondary | ICD-10-CM | POA: Diagnosis not present

## 2024-03-19 DIAGNOSIS — H43813 Vitreous degeneration, bilateral: Secondary | ICD-10-CM | POA: Diagnosis not present

## 2024-03-19 DIAGNOSIS — H2513 Age-related nuclear cataract, bilateral: Secondary | ICD-10-CM | POA: Diagnosis not present

## 2024-03-19 DIAGNOSIS — H35372 Puckering of macula, left eye: Secondary | ICD-10-CM | POA: Diagnosis not present

## 2024-04-24 DIAGNOSIS — H2513 Age-related nuclear cataract, bilateral: Secondary | ICD-10-CM | POA: Diagnosis not present

## 2024-04-24 DIAGNOSIS — H35372 Puckering of macula, left eye: Secondary | ICD-10-CM | POA: Diagnosis not present

## 2024-04-24 DIAGNOSIS — H43813 Vitreous degeneration, bilateral: Secondary | ICD-10-CM | POA: Diagnosis not present

## 2024-05-01 ENCOUNTER — Encounter: Payer: Self-pay | Admitting: Family Medicine

## 2024-05-01 ENCOUNTER — Ambulatory Visit: Payer: Self-pay | Admitting: Family Medicine

## 2024-05-01 ENCOUNTER — Ambulatory Visit (INDEPENDENT_AMBULATORY_CARE_PROVIDER_SITE_OTHER): Admitting: Family Medicine

## 2024-05-01 VITALS — BP 119/70 | HR 71 | Temp 97.4°F | Ht 68.5 in | Wt 182.0 lb

## 2024-05-01 DIAGNOSIS — K219 Gastro-esophageal reflux disease without esophagitis: Secondary | ICD-10-CM | POA: Diagnosis not present

## 2024-05-01 DIAGNOSIS — E871 Hypo-osmolality and hyponatremia: Secondary | ICD-10-CM

## 2024-05-01 DIAGNOSIS — T478X5A Adverse effect of other agents primarily affecting gastrointestinal system, initial encounter: Secondary | ICD-10-CM | POA: Diagnosis not present

## 2024-05-01 DIAGNOSIS — Z1211 Encounter for screening for malignant neoplasm of colon: Secondary | ICD-10-CM

## 2024-05-01 DIAGNOSIS — E875 Hyperkalemia: Secondary | ICD-10-CM

## 2024-05-01 DIAGNOSIS — Z0000 Encounter for general adult medical examination without abnormal findings: Secondary | ICD-10-CM | POA: Diagnosis not present

## 2024-05-01 DIAGNOSIS — Z125 Encounter for screening for malignant neoplasm of prostate: Secondary | ICD-10-CM | POA: Diagnosis not present

## 2024-05-01 DIAGNOSIS — Z23 Encounter for immunization: Secondary | ICD-10-CM | POA: Diagnosis not present

## 2024-05-01 DIAGNOSIS — E7800 Pure hypercholesterolemia, unspecified: Secondary | ICD-10-CM

## 2024-05-01 DIAGNOSIS — R7303 Prediabetes: Secondary | ICD-10-CM

## 2024-05-01 DIAGNOSIS — T471X5A Adverse effect of other antacids and anti-gastric-secretion drugs, initial encounter: Secondary | ICD-10-CM | POA: Diagnosis not present

## 2024-05-01 DIAGNOSIS — N401 Enlarged prostate with lower urinary tract symptoms: Secondary | ICD-10-CM

## 2024-05-01 LAB — HEMOGLOBIN A1C: Hgb A1c MFr Bld: 6.4 % (ref 4.6–6.5)

## 2024-05-01 LAB — LIPID PANEL
Cholesterol: 161 mg/dL (ref 0–200)
HDL: 46.2 mg/dL (ref >39.00)
LDL Cholesterol: 90 mg/dL (ref 0–99)
NonHDL: 114.4
Total CHOL/HDL Ratio: 3
Triglycerides: 122 mg/dL (ref 0.0–149.0)
VLDL: 24.4 mg/dL (ref 0.0–40.0)

## 2024-05-01 LAB — VITAMIN B12: Vitamin B-12: 493 pg/mL (ref 211–911)

## 2024-05-01 LAB — PSA, MEDICARE: PSA: 4.25 ng/mL — ABNORMAL HIGH (ref 0.10–4.00)

## 2024-05-01 LAB — FECAL OCCULT BLOOD, IMMUNOCHEMICAL: IFOBT: NEGATIVE

## 2024-05-01 NOTE — Assessment & Plan Note (Signed)
 Due for 10 year colonoscopy  Has it scheduled with Dr Kristie

## 2024-05-01 NOTE — Assessment & Plan Note (Signed)
 Continues omerpazole 20 mg daily  Well controlled Encouraged to avoid triggers Sees GI, Dr Kristie

## 2024-05-01 NOTE — Assessment & Plan Note (Signed)
 Psa today Also bph   History of prostate cancer in [place]

## 2024-05-01 NOTE — Assessment & Plan Note (Signed)
 Reviewed health habits including diet and exercise and skin cancer prevention Reviewed appropriate screening tests for age  Also reviewed health mt list, fam hx and immunization status , as well as social and family history   See HPI Labs reviewed and ordered Health Maintenance  Topic Date Due   Medicare Annual Wellness Visit  Never done   Flu Shot  03/16/2024   Colon Cancer Screening  06/05/2024   Zoster (Shingles) Vaccine (1 of 2) 05/31/2025*   COVID-19 Vaccine (3 - 2025-26 season) 05/17/2026*   DTaP/Tdap/Td vaccine (3 - Td or Tdap) 02/12/2025   Pneumococcal Vaccine for age over 17  Completed   Hepatitis C Screening  Completed   HPV Vaccine  Aged Out   Meningitis B Vaccine  Aged Out  *Topic was postponed. The date shown is not the original due date.    Flu shot today  Colonoscopy planned with Dr Kristie Discussed fall prevention, supplements and exercise for bone density  Psa today PHQ 1

## 2024-05-01 NOTE — Assessment & Plan Note (Signed)
 Psa today Doing better now that he is retired and no longer sitting all day

## 2024-05-01 NOTE — Patient Instructions (Addendum)
 Stay active  Bike/mowing/walking   Add some strength training to your routine, this is important for bone and brain health and can reduce your risk of falls and help your body use insulin properly and regulate weight  Light weights, exercise bands , and internet videos are a good way to start  Yoga (chair or regular), machines , floor exercises or a gym with machines are also good options    Flu shot today

## 2024-05-01 NOTE — Assessment & Plan Note (Signed)
 Lab today  Disc goals for lipids and reasons to control them Rev last labs with pt Rev low sat fat diet in detail  Not eating as well and plans to get back on track  Crestor  10 mg daily

## 2024-05-01 NOTE — Progress Notes (Signed)
 Subjective:    Patient ID: Richard Yates, male    DOB: 12/26/1955, 68 y.o.   MRN: [id number]  HPI  Here for health maintenance exam and to review chronic medical problems   Wt Readings from Last 3 Encounters:  05/01/24 182 lb (82.6 kg)  01/19/24 182 lb 6 oz (82.7 kg)  11/03/23 185 lb 4 oz (84 kg)   27.27 kg/m  Vitals:   05/01/24 1121 05/01/24 1144  BP: (!) 144/78 119/70  Pulse: 71   Temp: (!) 97.4 F (36.3 C)   SpO2: 99%     Immunization History  Administered Date(s) Administered   Fluad Trivalent(High Dose 65+) 06/21/2023   Hepatitis A, Adult 02/13/2015   INFLUENZA, HIGH DOSE SEASONAL PF 05/01/2024   Influenza Split 06/07/2012   Influenza, Seasonal, Injecte, Preservative Fre 07/17/2015   Influenza,inj,Quad PF,6+ Mos 07/10/2013, 08/20/2016   PFIZER(Purple Top)SARS-COV-2 Vaccination 10/29/2019, 11/20/2019   PNEUMOCOCCAL CONJUGATE-20 03/01/2023   Td 05/16/2005   Tdap 02/13/2015   Typhoid Live 11/21/2014   Yellow Fever 11/21/2014    Health Maintenance Due  Topic Date Due   Medicare Annual Wellness (AWV)  Never done   Colonoscopy  06/05/2024   Flu shot -today    Prostate health Lab Results  Component Value Date   PSA 2.90 02/23/2023   PSA 3.03 11/30/2021   PSA 1.14 01/28/2020   History of BPHD  With LUTS Sees urology   Less nocturia now  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  Due for psa     Colon cancer screening  Colonoscopy 05/2014 -due for 10 y and has scheduled with Dr Kristie   Bone health   Falls-none  [place]  Supplements  Mvi Fish oil  Vitamin E and D   Exercise  Biking / regular bike  [place] for 3 days per week  Push mows     Mood    05/01/2024   12:24 PM 11/03/2023    8:44 AM 03/01/2023    9:43 AM 12/07/2021    8:30 AM 02/01/2020   12:06 PM  Depression screen PHQ 2/9  Decreased Interest 0 0 0 0 0  Down, Depressed, Hopeless 0 0 0 0 0  PHQ - 2 Score 0 0 0 0 0  Altered sleeping 0 0 1  0  Tired,  decreased energy 0 0 0  1  Change in appetite 1 1 1  1   Feeling bad or failure about yourself  0 0 0  0  Trouble concentrating 0 0 0  0  Moving slowly or fidgety/restless 0 0 0  0  Suicidal thoughts 0 0 0  0  PHQ-9 Score 1 1 2  2   Difficult doing work/chores Not difficult at all Not difficult at all Not difficult at all      GERD Omeprazole 20 mg daily   Hyperlipidemia Lab Results  Component Value Date   CHOL 158 02/23/2023   HDL 40.00 02/23/2023   LDLCALC 96 02/23/2023   LDLDIRECT 162.2 12/13/2012   TRIG 114.0 02/23/2023   CHOLHDL 4 02/23/2023   Crestor  10 mg daily  Fish oil    Had liver enzyme elevation in march Lab Results  Component Value Date   ALT 151 (H) 11/07/2023   AST 61 (H) 11/07/2023   ALKPHOS 237 (H) 11/07/2023   BILITOT 1.0 11/07/2023   Fatty liver infiltration on us   No abn on CT Was referred to GI  Dr Kristie Per pt all numbers  came back to normal  Wonders if it was related to the gastroenteritis symptoms and vomiting Thought he may have had a muscle tear from vomiting   Has colonoscopy is planned    Prediabetes Lab Results  Component Value Date   HGBA1C 6.0 02/23/2023   HGBA1C 6.1 06/24/2022   HGBA1C 6.0 01/28/2020   Due for labs   Diet is not optimal  Fatty foods  Sugar         Patient Active Problem List   Diagnosis Date Noted   Diverticulosis of sigmoid colon 01/19/2024   Adverse effect of proton pump inhibitor 03/01/2023   BPH (benign prostatic hyperplasia) 03/01/2023   Allergic conjunctivitis 12/30/2020   Urge incontinence 08/20/2016   Neck pain, chronic 05/16/2015   Prediabetes 04/17/2014   Colon cancer screening 11/07/2013   Prostate cancer screening 07/10/2013   Routine general medical examination at a health care facility 05/29/2012   Allergic rhinitis 11/13/2008   DERMATITIS, SEBORRHEIC 08/29/2008   Hemorrhoids 06/07/2008   Hyperlipidemia 02/02/2007   GERD 02/02/2007   Past Medical History:  Diagnosis Date    Allergic rhinitis, cause unspecified    DDD (degenerative disc disease) 10/2000   Esophageal reflux    Pure hypercholesterolemia    Rash and other nonspecific skin eruption    Routine general medical examination at a health care facility    Seborrheic dermatitis, unspecified    Special screening for malignant neoplasm of prostate    Unspecified gastritis and gastroduodenitis without mention of hemorrhage    Unspecified hemorrhoids without mention of complication    Past Surgical History:  Procedure Laterality Date   COLONOSCOPY  10/2003   ESOPHAGOGASTRODUODENOSCOPY  10/2003   HH-had peptic bleeding, gastritis, neg H pylori   MRI     Social History   Tobacco Use   Smoking status: Never   Smokeless tobacco: Never  Substance Use Topics   Alcohol use: No    Alcohol/week: 0.0 standard drinks of alcohol   Drug use: No   Family History  Problem Relation Age of Onset   Diabetes Father        borderline; elevated PSA   Hypertension Mother    Hypertension Other        GM   Prostate cancer Paternal Grandfather    Stroke Maternal Grandfather    Parkinson's disease Maternal Aunt    Healthy Sister    Healthy Daughter        x 3   Allergies  Allergen Reactions   Penicillins Hives    REACTION: rash   Atorvastatin     REACTION: muscle aches/headache   Current Outpatient Medications on File Prior to Visit  Medication Sig Dispense Refill   Cetirizine HCl (ZYRTEC ALLERGY) 10 MG CAPS 1 day or 1 dose.     Multiple Vitamin (MULTIVITAMIN) tablet Take 1 tablet by mouth daily.     Omega-3 Fatty Acids (FISH OIL) 1200 MG CAPS Take 1 capsule by mouth daily.     omeprazole (PRILOSEC) 20 MG capsule Take 20 mg by mouth daily.     rosuvastatin  (CRESTOR ) 10 MG tablet Take 10 mg by mouth daily.     No current facility-administered medications on file prior to visit.    Review of Systems  Constitutional:  Negative for activity change, appetite change, fatigue, fever and unexpected weight  change.  HENT:  Negative for congestion, rhinorrhea, sore throat and trouble swallowing.   Eyes:  Negative for pain, redness, itching and visual disturbance.  Respiratory:  Negative for cough, chest tightness, shortness of breath and wheezing.   Cardiovascular:  Negative for chest pain and palpitations.  Gastrointestinal:  Negative for abdominal pain, blood in stool, constipation, diarrhea and nausea.  Endocrine: Negative for cold intolerance, heat intolerance, polydipsia and polyuria.  Genitourinary:  Negative for difficulty urinating, dysuria, frequency and urgency.  Musculoskeletal:  Negative for arthralgias, joint swelling and myalgias.  Skin:  Negative for pallor and rash.  Neurological:  Negative for dizziness, tremors, weakness, numbness and headaches.  Hematological:  Negative for adenopathy. Does not bruise/bleed easily.  Psychiatric/Behavioral:  Negative for decreased concentration and dysphoric mood. The patient is not nervous/anxious.        Objective:   Physical Exam Constitutional:      General: He is not in acute distress.    Appearance: Normal appearance. He is well-developed and normal weight. He is not ill-appearing or diaphoretic.  HENT:     Head: Normocephalic and atraumatic.     Right Ear: Tympanic membrane, ear canal and external ear normal.     Left Ear: Tympanic membrane, ear canal and external ear normal.     Nose: Nose normal. No congestion.     Mouth/Throat:     Mouth: Mucous membranes are moist.     Pharynx: Oropharynx is clear. No posterior oropharyngeal erythema.  Eyes:     General: No scleral icterus.       Right eye: No discharge.        Left eye: No discharge.     Conjunctiva/sclera: Conjunctivae normal.     Pupils: Pupils are equal, round, and reactive to light.  Neck:     Thyroid : No thyromegaly.     Vascular: No carotid bruit or JVD.  Cardiovascular:     Rate and Rhythm: Normal rate and regular rhythm.     Pulses: Normal pulses.     Heart  sounds: Normal heart sounds.     No gallop.  Pulmonary:     Effort: Pulmonary effort is normal. No respiratory distress.     Breath sounds: Normal breath sounds. No wheezing or rales.     Comments: Good air exch Chest:     Chest wall: No tenderness.  Abdominal:     General: Bowel sounds are normal. There is no distension or abdominal bruit.     Palpations: Abdomen is soft. There is no mass.     Tenderness: There is no abdominal tenderness.     Hernia: No hernia is present.  Musculoskeletal:        General: No tenderness.     Cervical back: Normal range of motion and neck supple. No rigidity. No muscular tenderness.     Right lower leg: No edema.     Left lower leg: No edema.  Lymphadenopathy:     Cervical: No cervical adenopathy.  Skin:    General: Skin is warm and dry.     Coloration: Skin is not pale.     Findings: No erythema or rash.     Comments: Solar lentigines diffusely Mildly tanned   Neurological:     Mental Status: He is alert.     Cranial Nerves: No cranial nerve deficit.     Motor: No abnormal muscle tone.     Coordination: Coordination normal.     Gait: Gait normal.     Deep Tendon Reflexes: Reflexes are normal and symmetric. Reflexes normal.  Psychiatric:        Mood and Affect: Mood normal.  Cognition and Memory: Cognition and memory normal.           Assessment & Plan:   Problem List Items Addressed This Visit       Digestive   GERD   Continues omerpazole 20 mg daily  Well controlled Encouraged to avoid triggers Sees GI, Dr Kristie        Genitourinary   BPH (benign prostatic hyperplasia)   Psa today Doing better now that he is retired and no longer sitting all day      Relevant Orders   PSA, Medicare     Other   Routine general medical examination at a health care facility - Primary   Reviewed health habits including diet and exercise and skin cancer prevention Reviewed appropriate screening tests for age  Also reviewed health  mt list, fam hx and immunization status , as well as social and family history   See HPI Labs reviewed and ordered Health Maintenance  Topic Date Due   Medicare Annual Wellness Visit  Never done   Flu Shot  03/16/2024   Colon Cancer Screening  06/05/2024   Zoster (Shingles) Vaccine (1 of 2) 05/31/2025*   COVID-19 Vaccine (3 - 2025-26 season) 05/17/2026*   DTaP/Tdap/Td vaccine (3 - Td or Tdap) 02/12/2025   Pneumococcal Vaccine for age over 47  Completed   Hepatitis C Screening  Completed   HPV Vaccine  Aged Out   Meningitis B Vaccine  Aged Out  *Topic was postponed. The date shown is not the original due date.    Flu shot today  Colonoscopy planned with Dr Kristie Discussed fall prevention, supplements and exercise for bone density  Psa today PHQ 1       Prostate cancer screening   Psa today Also bph   History of prostate cancer in GF      Relevant Orders   PSA, Medicare   Prediabetes   A1c today   disc imp of low glycemic diet and wt loss to prevent DM2    Diet not as good lately  Plans to get back on track       Relevant Orders   Comprehensive metabolic panel with GFR   Hemoglobin A1c   Hyperlipidemia   Lab today  Disc goals for lipids and reasons to control them Rev last labs with pt Rev low sat fat diet in detail  Not eating as well and plans to get back on track  Crestor  10 mg daily      Relevant Orders   Comprehensive metabolic panel with GFR   Lipid Panel   Colon cancer screening   Due for 10 year colonoscopy  Has it scheduled with Dr Kristie      Adverse effect of proton pump inhibitor   B12 added to labs Does take mvi and vit D      Relevant Orders   Vitamin B12   Other Visit Diagnoses       Need for influenza vaccination       Relevant Orders   Flu vaccine HIGH DOSE PF(Fluzone Trivalent) (Completed)

## 2024-05-01 NOTE — Assessment & Plan Note (Signed)
 A1c today   disc imp of low glycemic diet and wt loss to prevent DM2    Diet not as good lately  Plans to get back on track

## 2024-05-01 NOTE — Assessment & Plan Note (Signed)
 B12 added to labs Does take mvi and vit D

## 2024-05-04 DIAGNOSIS — E871 Hypo-osmolality and hyponatremia: Secondary | ICD-10-CM | POA: Insufficient documentation

## 2024-05-05 LAB — COMPREHENSIVE METABOLIC PANEL WITH GFR
ALT: 18 U/L (ref 0–53)
AST: 25 U/L (ref 0–37)
Albumin: 4.6 g/dL (ref 3.5–5.2)
Alkaline Phosphatase: 61 U/L (ref 39–117)
BUN: 12 mg/dL (ref 6–23)
CO2: 30 meq/L (ref 19–32)
Calcium: 10 mg/dL (ref 8.4–10.5)
Chloride: 104 meq/L (ref 96–112)
Creatinine, Ser: 1.08 mg/dL (ref 0.40–1.50)
GFR: 71.14 mL/min (ref >60.00)
Glucose, Bld: 93 mg/dL (ref 70–99)
Potassium: 5.4 meq/L — ABNORMAL HIGH (ref 3.5–5.1)
Sodium: 137 meq/L (ref 135–145)
Total Bilirubin: 1.1 mg/dL (ref 0.2–1.2)
Total Protein: 6.9 g/dL (ref 6.0–8.3)

## 2024-05-07 NOTE — Telephone Encounter (Signed)
 Copied from CRM #[id number]. Topic: Clinical - Lab/Test Results >> May 07, 2024 10:04 AM Maisie BROCKS wrote: Reason for CRM: pt called and I relayed lab results  and stated that he takes a multi vitamin and that's all. He said it does contain potassium, 150mcg. And he has not been experiencing vomiting/diarrhea.  Patient also scheduled f/u lab appt for 05/17/24

## 2024-05-08 DIAGNOSIS — R972 Elevated prostate specific antigen [PSA]: Secondary | ICD-10-CM | POA: Diagnosis not present

## 2024-05-09 ENCOUNTER — Encounter: Payer: Self-pay | Admitting: Family Medicine

## 2024-05-17 ENCOUNTER — Ambulatory Visit: Payer: Self-pay | Admitting: Family Medicine

## 2024-05-17 ENCOUNTER — Other Ambulatory Visit (INDEPENDENT_AMBULATORY_CARE_PROVIDER_SITE_OTHER)

## 2024-05-17 DIAGNOSIS — E871 Hypo-osmolality and hyponatremia: Secondary | ICD-10-CM

## 2024-05-17 DIAGNOSIS — E875 Hyperkalemia: Secondary | ICD-10-CM

## 2024-05-17 LAB — BASIC METABOLIC PANEL WITH GFR
BUN: 12 mg/dL (ref 6–23)
CO2: 29 meq/L (ref 19–32)
Calcium: 9.7 mg/dL (ref 8.4–10.5)
Chloride: 103 meq/L (ref 96–112)
Creatinine, Ser: 1 mg/dL (ref 0.40–1.50)
GFR: 77.99 mL/min (ref >60.00)
Glucose, Bld: 102 mg/dL — ABNORMAL HIGH (ref 70–99)
Potassium: 4.2 meq/L (ref 3.5–5.1)
Sodium: 138 meq/L (ref 135–145)

## 2024-06-04 DIAGNOSIS — R972 Elevated prostate specific antigen [PSA]: Secondary | ICD-10-CM | POA: Diagnosis not present

## 2024-06-06 DIAGNOSIS — Z1211 Encounter for screening for malignant neoplasm of colon: Secondary | ICD-10-CM | POA: Diagnosis not present

## 2024-06-06 DIAGNOSIS — K648 Other hemorrhoids: Secondary | ICD-10-CM | POA: Diagnosis not present

## 2024-06-06 DIAGNOSIS — K5730 Diverticulosis of large intestine without perforation or abscess without bleeding: Secondary | ICD-10-CM | POA: Diagnosis not present

## 2024-06-06 DIAGNOSIS — K644 Residual hemorrhoidal skin tags: Secondary | ICD-10-CM | POA: Diagnosis not present

## 2024-06-06 DIAGNOSIS — K635 Polyp of colon: Secondary | ICD-10-CM | POA: Diagnosis not present

## 2024-06-06 LAB — HM COLONOSCOPY
